# Patient Record
Sex: Female | Born: 1980 | Race: White | Hispanic: No | Marital: Married | Smoking: Former smoker
Health system: Southern US, Community
[De-identification: ages and names within clinical notes are randomized; demographics above are authoritative.]

## PROBLEM LIST (undated history)

## (undated) DIAGNOSIS — Z8659 Personal history of other mental and behavioral disorders: Secondary | ICD-10-CM

## (undated) DIAGNOSIS — R102 Pelvic and perineal pain unspecified side: Secondary | ICD-10-CM

## (undated) DIAGNOSIS — F952 Tourette's disorder: Secondary | ICD-10-CM

## (undated) DIAGNOSIS — F419 Anxiety disorder, unspecified: Secondary | ICD-10-CM

## (undated) DIAGNOSIS — E7212 Methylenetetrahydrofolate reductase deficiency: Secondary | ICD-10-CM

## (undated) DIAGNOSIS — N920 Excessive and frequent menstruation with regular cycle: Secondary | ICD-10-CM

## (undated) DIAGNOSIS — K5909 Other constipation: Secondary | ICD-10-CM

## (undated) DIAGNOSIS — D259 Leiomyoma of uterus, unspecified: Secondary | ICD-10-CM

## (undated) DIAGNOSIS — Z1589 Genetic susceptibility to other disease: Secondary | ICD-10-CM

## (undated) DIAGNOSIS — Z8742 Personal history of other diseases of the female genital tract: Secondary | ICD-10-CM

## (undated) DIAGNOSIS — R760 Raised antibody titer: Secondary | ICD-10-CM

## (undated) DIAGNOSIS — R87629 Unspecified abnormal cytological findings in specimens from vagina: Secondary | ICD-10-CM

## (undated) HISTORY — DX: Genetic susceptibility to other disease: Z15.89

## (undated) HISTORY — DX: Unspecified abnormal cytological findings in specimens from vagina: R87.629

## (undated) HISTORY — DX: Methylenetetrahydrofolate reductase deficiency: E72.12

## (undated) HISTORY — DX: Raised antibody titer: R76.0

## (undated) HISTORY — PX: NO PAST SURGERIES: SHX2092

---

## 2013-02-19 HISTORY — PX: COLONOSCOPY WITH PROPOFOL: SHX5780

## 2015-08-09 LAB — OB RESULTS CONSOLE ANTIBODY SCREEN: Antibody Screen: NEGATIVE

## 2015-08-09 LAB — OB RESULTS CONSOLE ABO/RH: RH Type: POSITIVE

## 2015-08-09 LAB — OB RESULTS CONSOLE RPR: RPR: NONREACTIVE

## 2015-08-09 LAB — OB RESULTS CONSOLE GC/CHLAMYDIA
CHLAMYDIA, DNA PROBE: NEGATIVE
GC PROBE AMP, GENITAL: NEGATIVE

## 2015-08-09 LAB — OB RESULTS CONSOLE RUBELLA ANTIBODY, IGM: Rubella: IMMUNE

## 2015-08-09 LAB — OB RESULTS CONSOLE HEPATITIS B SURFACE ANTIGEN: Hepatitis B Surface Ag: NEGATIVE

## 2015-08-09 LAB — OB RESULTS CONSOLE HIV ANTIBODY (ROUTINE TESTING): HIV: NONREACTIVE

## 2016-01-09 NOTE — L&D Delivery Note (Signed)
Delivery Note: requested for CNM birth, MD in OR  First Stage: Labor onset: 2/21 @2300  IOL with cytotec and pitocin  Analgesia /Anesthesia intrapartum: epidural AROM at 0226 Amnioinfusion for deep variable decelerations   Second Stage: Complete dilation at 1056 Onset of pushing at 1100 FHR second stage Cat 3- repositioned, oxygen given   Delivery of a viable female at 11 by CNM in ROA position No nuchal cord Cord double clamped after cessation of pulsation, cut by father of baby Cord blood sample collected   Arterial cord blood sample collected   Third Stage: Placenta delivered [facility] intact with 3 VC @ 1143 Placenta disposition: pathology Uterine tone firm / bleeding moderate   1st degree perineal and sulcus laceration identified  Anesthesia for repair: epidural Repair 3.0 vicryl, 4.0 vicryl  Est. Blood Loss (mL): [id number]  Complications: IUGR and oligo   Mom to postpartum.  Baby to Couplet care / Skin to Skin.  Newborn: Baby Name: Julia Santos  Birth Weight: [id number] (6lb 8.4oz) Apgar Scores: 9/9  Feeding planned: Breast  Darrol Poke BSN, SNM 03/01/2016, 12:05 PM

## 2016-02-22 LAB — OB RESULTS CONSOLE GBS: STREP GROUP B AG: POSITIVE

## 2016-02-28 ENCOUNTER — Telehealth (HOSPITAL_COMMUNITY): Payer: Self-pay | Admitting: *Deleted

## 2016-02-28 ENCOUNTER — Other Ambulatory Visit: Payer: Self-pay | Admitting: Obstetrics & Gynecology

## 2016-02-28 ENCOUNTER — Encounter (HOSPITAL_COMMUNITY): Payer: Self-pay | Admitting: *Deleted

## 2016-02-28 NOTE — Telephone Encounter (Signed)
Preadmission screen  

## 2016-02-29 ENCOUNTER — Encounter (HOSPITAL_COMMUNITY): Payer: Self-pay | Admitting: *Deleted

## 2016-02-29 ENCOUNTER — Inpatient Hospital Stay (HOSPITAL_COMMUNITY)
Admission: AD | Admit: 2016-02-29 | Discharge: 2016-03-03 | DRG: 775 | Disposition: A | Discharge status: Home / Self Care | Payer: Commercial Managed Care - PPO | Source: Ambulatory Visit | Attending: Obstetrics and Gynecology | Admitting: Obstetrics and Gynecology

## 2016-02-29 ENCOUNTER — Other Ambulatory Visit: Payer: Self-pay | Admitting: Obstetrics & Gynecology

## 2016-02-29 DIAGNOSIS — O9902 Anemia complicating childbirth: Secondary | ICD-10-CM | POA: Diagnosis not present

## 2016-02-29 DIAGNOSIS — O365930 Maternal care for other known or suspected poor fetal growth, third trimester, not applicable or unspecified: Secondary | ICD-10-CM | POA: Diagnosis present

## 2016-02-29 DIAGNOSIS — O480 Post-term pregnancy: Secondary | ICD-10-CM | POA: Diagnosis present

## 2016-02-29 DIAGNOSIS — Z833 Family history of diabetes mellitus: Secondary | ICD-10-CM | POA: Diagnosis not present

## 2016-02-29 DIAGNOSIS — D6861 Antiphospholipid syndrome: Secondary | ICD-10-CM | POA: Diagnosis present

## 2016-02-29 DIAGNOSIS — Z3490 Encounter for supervision of normal pregnancy, unspecified, unspecified trimester: Secondary | ICD-10-CM

## 2016-02-29 DIAGNOSIS — O99824 Streptococcus B carrier state complicating childbirth: Secondary | ICD-10-CM | POA: Diagnosis present

## 2016-02-29 DIAGNOSIS — Z3A40 40 weeks gestation of pregnancy: Secondary | ICD-10-CM | POA: Diagnosis not present

## 2016-02-29 DIAGNOSIS — O4103X0 Oligohydramnios, third trimester, not applicable or unspecified: Secondary | ICD-10-CM | POA: Diagnosis present

## 2016-02-29 DIAGNOSIS — Z8249 Family history of ischemic heart disease and other diseases of the circulatory system: Secondary | ICD-10-CM

## 2016-02-29 DIAGNOSIS — O9912 Other diseases of the blood and blood-forming organs and certain disorders involving the immune mechanism complicating childbirth: Secondary | ICD-10-CM | POA: Diagnosis present

## 2016-02-29 DIAGNOSIS — D649 Anemia, unspecified: Secondary | ICD-10-CM | POA: Diagnosis present

## 2016-02-29 LAB — TYPE AND SCREEN
ABO/RH(D): A POS
Antibody Screen: NEGATIVE

## 2016-02-29 LAB — CBC
HCT: 34.7 % — ABNORMAL LOW (ref 36.0–46.0)
Hemoglobin: 12.6 g/dL (ref 12.0–15.0)
MCH: 34.4 pg — AB (ref 26.0–34.0)
MCHC: 36.3 g/dL — AB (ref 30.0–36.0)
MCV: 94.8 fL (ref 78.0–100.0)
PLATELETS: 293 10*3/uL (ref 150–400)
RBC: 3.66 MIL/uL — AB (ref 3.87–5.11)
RDW: 12 % (ref 11.5–15.5)
WBC: 10.3 10*3/uL (ref 4.0–10.5)

## 2016-02-29 LAB — ABO/RH: ABO/RH(D): A POS

## 2016-02-29 MED ORDER — LACTATED RINGERS IV SOLN: 500.0000 mL | INTRAVENOUS | Status: DC | PRN

## 2016-02-29 MED ORDER — PENICILLIN G POTASSIUM 5000000 UNITS IJ SOLR: 2.5000 10*6.[IU] | INTRAVENOUS | Status: DC

## 2016-02-29 MED ORDER — PENICILLIN G POTASSIUM 5000000 UNITS IJ SOLR
5.0000 10*6.[IU] | Freq: Once | INTRAVENOUS | Status: AC
  Administered 2016-02-29: 5 10*6.[IU] via INTRAVENOUS
  Filled 2016-02-29: qty 5

## 2016-02-29 MED ORDER — PENICILLIN G POTASSIUM 5000000 UNITS IJ SOLR: 5.0000 10*6.[IU] | Freq: Once | INTRAVENOUS | Status: DC

## 2016-02-29 MED ORDER — SOD CITRATE-CITRIC ACID 500-334 MG/5ML PO SOLN
30.0000 mL | ORAL | Status: DC | PRN
  Administered 2016-03-01: 30 mL via ORAL
  Filled 2016-02-29: qty 15

## 2016-02-29 MED ORDER — LIDOCAINE HCL (PF) 1 % IJ SOLN
30.0000 mL | INTRAMUSCULAR | Status: DC | PRN
  Filled 2016-02-29: qty 30

## 2016-02-29 MED ORDER — OXYTOCIN BOLUS FROM INFUSION
500.0000 mL | Freq: Once | INTRAVENOUS | Status: AC
  Administered 2016-03-01: 500 mL via INTRAVENOUS

## 2016-02-29 MED ORDER — TERBUTALINE SULFATE 1 MG/ML IJ SOLN
0.2500 mg | Freq: Once | INTRAMUSCULAR | Status: DC | PRN
  Filled 2016-02-29: qty 1

## 2016-02-29 MED ORDER — OXYCODONE-ACETAMINOPHEN 5-325 MG PO TABS: 1.0000 | ORAL_TABLET | ORAL | Status: DC | PRN

## 2016-02-29 MED ORDER — OXYTOCIN 40 UNITS IN LACTATED RINGERS INFUSION - SIMPLE MED: 2.5000 [IU]/h | INTRAVENOUS | Status: DC

## 2016-02-29 MED ORDER — ACETAMINOPHEN 325 MG PO TABS: 650.0000 mg | ORAL_TABLET | ORAL | Status: DC | PRN

## 2016-02-29 MED ORDER — OXYCODONE-ACETAMINOPHEN 5-325 MG PO TABS: 2.0000 | ORAL_TABLET | ORAL | Status: DC | PRN

## 2016-02-29 MED ORDER — ONDANSETRON HCL 4 MG/2ML IJ SOLN
4.0000 mg | Freq: Four times a day (QID) | INTRAMUSCULAR | Status: DC | PRN
  Filled 2016-02-29 (×2): qty 2

## 2016-02-29 MED ORDER — LACTATED RINGERS IV SOLN
INTRAVENOUS | Status: DC
  Administered 2016-02-29 – 2016-03-01 (×5): via INTRAVENOUS

## 2016-02-29 MED ORDER — MISOPROSTOL 25 MCG QUARTER TABLET: 25.0000 ug | ORAL_TABLET | ORAL | Status: DC | PRN

## 2016-02-29 MED ORDER — MISOPROSTOL 25 MCG QUARTER TABLET
25.0000 ug | ORAL_TABLET | ORAL | Status: DC | PRN
  Administered 2016-02-29 (×2): 25 ug via VAGINAL
  Filled 2016-02-29 (×2): qty 0.25
  Filled 2016-02-29: qty 1
  Filled 2016-02-29: qty 0.25

## 2016-02-29 MED ORDER — PENICILLIN G POTASSIUM 5000000 UNITS IJ SOLR
2.5000 10*6.[IU] | INTRAVENOUS | Status: DC
  Administered 2016-02-29 (×2): 2.5 10*6.[IU] via INTRAVENOUS
  Filled 2016-02-29 (×6): qty 2.5

## 2016-02-29 NOTE — Anesthesia Pain Management Evaluation Note (Signed)
  CRNA Pain Management Visit Note  Patient: Julia Santos, 36 y.o., female  "Hello I am a member of the anesthesia team at [facility]. We have an anesthesia team available at all times to provide care throughout the hospital, including epidural management and anesthesia for C-section. I don't know your plan for the delivery whether it a natural birth, water birth, IV sedation, nitrous supplementation, doula or epidural, but we want to meet your pain goals."   1.Was your pain managed to your expectations on prior hospitalizations?   No prior hospitalizations  2.What is your expectation for pain management during this hospitalization?     Nitrous Oxide  3.How can we help you reach that goal? Be available,  Record the patient's initial score and the patient's pain goal.   Pain: 0  Pain Goal: 7 [facility] wants you to be able to say your pain was always managed very well.  Ninnie Fein 02/29/2016

## 2016-02-29 NOTE — H&P (Addendum)
Julia Santos is a 36 y.o. female G29P0A3 with Antiphospholipid Syndrome at [redacted]w[redacted]d presenting for Induction of Labor re BPP 6/10 with SGA and low AFI.  OB History    Gravida Para Term Preterm AB Living   1             SAB TAB Ectopic Multiple Live Births                 Past Medical History:  Diagnosis Date  . Anticardiolipin antibody positive   . Heterozygous MTHFR mutation C677T ([place])   . Vaginal Pap smear, abnormal    No past surgical history on file. Family History: family history includes COPD in her maternal grandfather and paternal aunt; Diabetes in her maternal grandfather; Esophageal cancer in her maternal uncle; Hypertension in her father. Social History:  has no tobacco, alcohol, and drug history on file.  Allergies not on file    Vital Signs    Report    02/20 0700 02/21 0659 02/21 0700 02/21 1537  Most Recent    Temp (F)   98.3  98.3 (36.8)  02/21 1423  Pulse Rate   105  105  02/21 1423  Resp   18  18  02/21 1423  BP   118/80  118/80  02/21 1423  Weight (lb)   191  191 lb (86.6 kg)  02/21 1407   Exam   Physical Exam   FHR 140's with good variability, no deceleration, accelerations present. No reg UC.  VE at [employer]:  Closed/long/Vtx/-3 Membranes intact.  HPP: There are no active problems to display for this patient.   Prenatal labs: ABO, Rh: A/Positive/-- (08/01 0000) Antibody: Negative (08/01 0000) Rubella: Immune RPR: Nonreactive (08/01 0000)  HBsAg: Negative (08/01 0000)  HIV: Non-reactive (08/01 0000)  Genetic testing: Panorama low risk female, AFP1 neg [place] anato: wnl female 1 hr GTT: 106 wnl GBS: Positive (02/14 0000)   Assessment/Plan: 36 yo G4P0A3 with Antiphospholipid Syndrome for Induction for BPP 6/10 with SGA and low AFI.  Unfavorable cervix, Cytotec first, then Pitocin.  Continuous monitoring.  Risks of Urgent C/S in labor discussed.  GBS pos, Pen G in labor.  Dr. Garwin Brothers on call informed of plan of care and will manage  patient.  Julia Santos 02/29/2016, 1:58 PM

## 2016-03-01 ENCOUNTER — Inpatient Hospital Stay (HOSPITAL_COMMUNITY): Payer: Commercial Managed Care - PPO | Admitting: Anesthesiology

## 2016-03-01 ENCOUNTER — Encounter (HOSPITAL_COMMUNITY): Payer: Self-pay | Admitting: *Deleted

## 2016-03-01 LAB — RPR: RPR Ser Ql: NONREACTIVE

## 2016-03-01 MED ORDER — SENNOSIDES-DOCUSATE SODIUM 8.6-50 MG PO TABS
2.0000 | ORAL_TABLET | ORAL | Status: DC
  Administered 2016-03-01 – 2016-03-03 (×2): 2 via ORAL
  Filled 2016-03-01 (×2): qty 2

## 2016-03-01 MED ORDER — SIMETHICONE 80 MG PO CHEW: 80.0000 mg | CHEWABLE_TABLET | ORAL | Status: DC | PRN

## 2016-03-01 MED ORDER — PRENATAL MULTIVITAMIN CH
1.0000 | ORAL_TABLET | Freq: Every day | ORAL | Status: DC
  Administered 2016-03-02 – 2016-03-03 (×2): 1 via ORAL
  Filled 2016-03-01 (×2): qty 1

## 2016-03-01 MED ORDER — PENICILLIN G POT IN DEXTROSE 60000 UNIT/ML IV SOLN
3.0000 10*6.[IU] | INTRAVENOUS | Status: DC
Start: 2016-03-01 — End: 2016-03-01
  Administered 2016-03-01 (×2): 3 10*6.[IU] via INTRAVENOUS
  Filled 2016-03-01 (×9): qty 50

## 2016-03-01 MED ORDER — DIPHENHYDRAMINE HCL 25 MG PO CAPS: 25.0000 mg | ORAL_CAPSULE | Freq: Four times a day (QID) | ORAL | Status: DC | PRN

## 2016-03-01 MED ORDER — LACTATED RINGERS IV SOLN
500.0000 mL | Freq: Once | INTRAVENOUS | Status: AC
  Administered 2016-03-01: 500 mL via INTRAVENOUS

## 2016-03-01 MED ORDER — WITCH HAZEL-GLYCERIN EX PADS: 1.0000 "application " | MEDICATED_PAD | CUTANEOUS | Status: DC | PRN

## 2016-03-01 MED ORDER — NALBUPHINE HCL 10 MG/ML IJ SOLN
10.0000 mg | Freq: Once | INTRAMUSCULAR | Status: AC
  Administered 2016-03-01: 10 mg via INTRAVENOUS
  Filled 2016-03-01: qty 1

## 2016-03-01 MED ORDER — PROMETHAZINE HCL 25 MG/ML IJ SOLN
12.5000 mg | Freq: Once | INTRAMUSCULAR | Status: AC
  Administered 2016-03-01: 12.5 mg via INTRAVENOUS
  Filled 2016-03-01: qty 1

## 2016-03-01 MED ORDER — PHENYLEPHRINE 40 MCG/ML (10ML) SYRINGE FOR IV PUSH (FOR BLOOD PRESSURE SUPPORT)
80.0000 ug | PREFILLED_SYRINGE | INTRAVENOUS | Status: DC | PRN
  Filled 2016-03-01: qty 5
  Filled 2016-03-01: qty 10

## 2016-03-01 MED ORDER — NALBUPHINE HCL 10 MG/ML IJ SOLN
10.0000 mg | Freq: Once | INTRAMUSCULAR | Status: AC
  Administered 2016-03-01: 10 mg via INTRAMUSCULAR
  Filled 2016-03-01: qty 1

## 2016-03-01 MED ORDER — EPHEDRINE 5 MG/ML INJ
10.0000 mg | INTRAVENOUS | Status: DC | PRN
  Filled 2016-03-01: qty 4

## 2016-03-01 MED ORDER — ONDANSETRON HCL 4 MG/2ML IJ SOLN: 4.0000 mg | INTRAMUSCULAR | Status: DC | PRN

## 2016-03-01 MED ORDER — PHENYLEPHRINE 40 MCG/ML (10ML) SYRINGE FOR IV PUSH (FOR BLOOD PRESSURE SUPPORT)
80.0000 ug | PREFILLED_SYRINGE | INTRAVENOUS | Status: DC | PRN
  Administered 2016-03-01 (×2): 80 ug via INTRAVENOUS
  Filled 2016-03-01: qty 5

## 2016-03-01 MED ORDER — ACETAMINOPHEN 325 MG PO TABS
650.0000 mg | ORAL_TABLET | ORAL | Status: DC | PRN
  Administered 2016-03-02 (×3): 650 mg via ORAL
  Filled 2016-03-01 (×3): qty 2

## 2016-03-01 MED ORDER — COCONUT OIL OIL: 1.0000 "application " | TOPICAL_OIL | Status: DC | PRN

## 2016-03-01 MED ORDER — LACTATED RINGERS IV SOLN
INTRAVENOUS | Status: DC
  Administered 2016-03-01: 11:00:00 via INTRAUTERINE

## 2016-03-01 MED ORDER — ONDANSETRON HCL 4 MG PO TABS: 4.0000 mg | ORAL_TABLET | ORAL | Status: DC | PRN

## 2016-03-01 MED ORDER — BENZOCAINE-MENTHOL 20-0.5 % EX AERO: 1.0000 "application " | INHALATION_SPRAY | CUTANEOUS | Status: DC | PRN

## 2016-03-01 MED ORDER — FENTANYL 2.5 MCG/ML BUPIVACAINE 1/10 % EPIDURAL INFUSION (WH - ANES)
14.0000 mL/h | INTRAMUSCULAR | Status: DC | PRN
  Administered 2016-03-01: 14 mL/h via EPIDURAL
  Filled 2016-03-01 (×2): qty 100

## 2016-03-01 MED ORDER — LIDOCAINE HCL (PF) 1 % IJ SOLN
INTRAMUSCULAR | Status: DC | PRN
  Administered 2016-03-01: 6 mL
  Administered 2016-03-01: 4 mL via EPIDURAL

## 2016-03-01 MED ORDER — BUTORPHANOL TARTRATE 1 MG/ML IJ SOLN: 2.0000 mg | Freq: Once | INTRAMUSCULAR | Status: DC

## 2016-03-01 MED ORDER — IBUPROFEN 600 MG PO TABS
600.0000 mg | ORAL_TABLET | Freq: Four times a day (QID) | ORAL | Status: DC
  Administered 2016-03-01 – 2016-03-03 (×8): 600 mg via ORAL
  Filled 2016-03-01 (×9): qty 1

## 2016-03-01 MED ORDER — DIPHENHYDRAMINE HCL 50 MG/ML IJ SOLN: 12.5000 mg | INTRAMUSCULAR | Status: DC | PRN

## 2016-03-01 MED ORDER — OXYTOCIN 40 UNITS IN LACTATED RINGERS INFUSION - SIMPLE MED
1.0000 m[IU]/min | INTRAVENOUS | Status: DC
  Administered 2016-03-01: 2 m[IU]/min via INTRAVENOUS
  Filled 2016-03-01: qty 1000

## 2016-03-01 MED ORDER — DIBUCAINE 1 % RE OINT: 1.0000 "application " | TOPICAL_OINTMENT | RECTAL | Status: DC | PRN

## 2016-03-01 NOTE — Progress Notes (Signed)
Update since AROM  Tracing reviewed. Baseline 140  Small variables occ  Subtle late ( non repetitive). Some early Small variability Ctx q 2-4mins  IMP; oligohydramnios Post term Tracing ok P) close fetal monitoring. Defer pitocin augmentation and ctx are becoming more regular. See if spont active labor going forward

## 2016-03-01 NOTE — Anesthesia Preprocedure Evaluation (Signed)

## 2016-03-01 NOTE — Progress Notes (Signed)
S/p epidural BP (!) 95/54   Pulse 82   Temp 98 F (36.7 C) (Oral)   Resp 18   Ht 5\' 7"  (1.702 m)   Wt 86.6 kg (191 lb)   SpO2 100%   BMI 29.91 kg/m   Currently with low BP post epidural  VE deferred  Tracing: baseline 140 (+) min variability with intermittent late and some early decel Ctx q 4-5 mins  IMP: oligohydramnios Term gestation Hypotension related tracing decel P)resolve BP issue and pitocin augmentation with  Reassuring tracing

## 2016-03-01 NOTE — Anesthesia Procedure Notes (Signed)
Epidural Patient location during procedure: OB  Staffing Anesthesiologist: Lyndle Herrlich Performed: anesthesiologist   Preanesthetic Checklist Completed: patient identified, site marked, surgical consent, pre-op evaluation, timeout performed, IV checked, risks and benefits discussed and monitors and equipment checked  Epidural Patient position: sitting Prep: Betadine Patient monitoring: heart rate, continuous pulse ox and blood pressure Location: L3-L4  Needle:  Needle type: Hustead  Needle gauge: 17 G Needle length: 9 cm and 9 Needle insertion depth: 5 cm Catheter type: closed end flexible Catheter size: 19 Gauge Catheter at skin depth: 11 cm Test dose: negative  Additional Notes Reason for block:procedure for pain

## 2016-03-01 NOTE — Lactation Note (Addendum)
This note was copied from a baby's chart. Lactation Consultation Note  Patient Name: Julia Santos [id number] Date: 03/01/2016 Reason for consult: Initial assessment   Initial assessment with first time mom at < 1 hour old in [place]. Mom reports she took BF class at [facility].  Mom had infant at breast and infant on and off the breast shallowly. Enc mom to pull infant in a little closer with latch. Mom did well with holding and latching infant. Reviewed BF basics, positioning, STS, pillow and head support, colostrum, milk coming to volume and hand expression. Enc mom to massage/compress breast with feeding. Infant on and off the breast with short suckling bursts while LC in room. Mom denied nipple pain/tenderness. Feeding log given with instructions for use.   Showed mom how to hand express, mom returned demo. No colostrum noted with hand expression to right breast at this time, infant latched to left breast. Mom reports breasts got a little bigger during pregnancy. Mom with compressible breasts and areola with some areolar puffiness and short shaft small nipples.   Enc mom to feed infant STS 8-12 x in 24 hours at first feeding cues. BF Resources Handout and [place] reviewed, mom informed of IP/OP Services, BF Support Groups and [place] phone #. Enc mom to call out to desk for feeding assistance as needed. Mom has a Spectra 2 pump for home use.    Maternal Data Formula Feeding for Exclusion: No Has patient been taught Hand Expression?: Yes Does the patient have breastfeeding experience prior to this delivery?: No  Feeding Feeding Type: Breast Fed Length of feed: 10 min  LATCH Score/Interventions Latch: Repeated attempts needed to sustain latch, nipple held in mouth throughout feeding, stimulation needed to elicit sucking reflex. Intervention(s): Adjust position;Assist with latch;Breast massage;Breast compression  Audible Swallowing: A few with stimulation Intervention(s): Skin to  skin;Hand expression;Alternate breast massage  Type of Nipple: Everted at rest and after stimulation  Comfort (Breast/Nipple): Soft / non-tender     Hold (Positioning): Assistance needed to correctly position infant at breast and maintain latch. Intervention(s): Breastfeeding basics reviewed;Support Pillows;Position options;Skin to skin  LATCH Score: 7  Lactation Tools Discussed/Used WIC Program: No   Consult Status Consult Status: Follow-up Date: 03/02/16 Follow-up type: In-patient    Debby Freiberg Kamren Heintzelman 03/01/2016, 12:26 PM

## 2016-03-01 NOTE — Progress Notes (Signed)
IOL by Dr Dellis Filbert S/p cytotec. Due for cytotec but was contracting too much Pt c/o pain. Tried nitrous oxide w/o relief S/P Nubain IV/IM  Tracing notable for 2 episodes of decelerations lasting 2 1/2- 35mins unclear if related To ctx. Baseline 135 decreased variability thought related to Nubain  VE loose 1 cm/80/-2 post AROM clear fluid ISE/IUPC placed  IMP: Fetal heart deceleration ? Variable vs spont P) assess fetal wellbeing with internal monitoring. If persistent decels, then will need C/S. Pt and family notified If need more analgesic for pain mgmt, will proceed with epidural given in the event of need for  Surgical intervention

## 2016-03-02 DIAGNOSIS — O9902 Anemia complicating childbirth: Secondary | ICD-10-CM | POA: Diagnosis not present

## 2016-03-02 LAB — CBC
HCT: 25.6 % — ABNORMAL LOW (ref 36.0–46.0)
Hemoglobin: 9.4 g/dL — ABNORMAL LOW (ref 12.0–15.0)
MCH: 35.1 pg — ABNORMAL HIGH (ref 26.0–34.0)
MCHC: 36.7 g/dL — ABNORMAL HIGH (ref 30.0–36.0)
MCV: 95.5 fL (ref 78.0–100.0)
PLATELETS: 207 10*3/uL (ref 150–400)
RBC: 2.68 MIL/uL — ABNORMAL LOW (ref 3.87–5.11)
RDW: 12.2 % (ref 11.5–15.5)
WBC: 10.6 10*3/uL — AB (ref 4.0–10.5)

## 2016-03-02 MED ORDER — POLYSACCHARIDE IRON COMPLEX 150 MG PO CAPS
150.0000 mg | ORAL_CAPSULE | Freq: Every day | ORAL | Status: DC
  Administered 2016-03-03: 150 mg via ORAL
  Filled 2016-03-02: qty 1

## 2016-03-02 MED ORDER — MAGNESIUM OXIDE 400 (241.3 MG) MG PO TABS
400.0000 mg | ORAL_TABLET | Freq: Every day | ORAL | Status: DC
  Administered 2016-03-03: 400 mg via ORAL
  Filled 2016-03-02 (×3): qty 1

## 2016-03-02 NOTE — Anesthesia Postprocedure Evaluation (Signed)
Anesthesia Post Note  Patient: Julia Santos  Procedure(s) Performed: * No procedures listed *  Patient location during evaluation: Mother Baby Anesthesia Type: Epidural Level of consciousness: awake and alert, oriented and patient cooperative Pain management: pain level controlled Vital Signs Assessment: post-procedure vital signs reviewed and stable Respiratory status: spontaneous breathing Cardiovascular status: stable Postop Assessment: no headache, epidural receding, patient able to bend at knees and no signs of nausea or vomiting Anesthetic complications: no Comments: Pain Score 2.        Last Vitals:  Vitals:   03/01/16 1847 03/02/16 0620  BP: (!) 124/58 (!) 95/45  Pulse: 76 66  Resp: 18 16  Temp: 36.8 C 36.7 C    Last Pain:  Vitals:   03/02/16 0620  TempSrc: Oral  PainSc:    Pain Goal:                 [facility]

## 2016-03-02 NOTE — Lactation Note (Signed)
This note was copied from a baby's chart. Lactation Consultation Note  Patient Name: Girl Yaritzy Eborn [id number] Date: 03/02/2016 Reason for consult: Follow-up assessment;Hyperbilirubinemia  Visited with Mom, baby 58 hrs old.  Mom resting in bed, and baby just started on single phototherapy with a bilirubin level of 10.5.  Offered assist with latching baby.  Mom very calm and handles the latch very well.  Understands importance of a deep areolar latch.  Baby latched well in football hold.  Baby sleepy and needing frequent stimulation.  Set up DEBP at bedside, and instructed Mom to call her RN to assist her with her first pumping.  Demonstrated alternate breast compression while baby feeding.   Encouraged waking baby at 3 hrs to place baby STS to encourage more feedings.   Mom aware of sleepiness at the breast associated with jaundice. Lactation to follow up prn and in morning.  Consult Status Consult Status: Follow-up Date: 03/03/16 Follow-up type: In-patient    Broadus John 03/02/2016, 5:21 PM

## 2016-03-02 NOTE — Lactation Note (Signed)
This note was copied from a baby's chart. Lactation Consultation Note  Patient Name: Julia Santos [id number] Date: 03/02/2016 Reason for consult: Follow-up assessment;Hyperbilirubinemia Mom called for assist with using DEBP. Reviewed pumping, how to clean parts/pieces. Offered to assist with latch but baby sleeping and parents report recently BF. Encouraged Mom to call with next feeding for assist since she reports having difficulty with baby latching to right breast. Encouraged Mom to pump every 3 hours for 15 minutes to encourage milk production and to have EBM to supplement. Mom to call for latch assist.   Maternal Data    Feeding Feeding Type: Breast Milk  LATCH Score/Interventions Latch: Repeated attempts needed to sustain latch, nipple held in mouth throughout feeding, stimulation needed to elicit sucking reflex. Intervention(s): Adjust position;Assist with latch;Breast massage;Breast compression  Audible Swallowing: A few with stimulation Intervention(s): Skin to skin;Hand expression;Alternate breast massage  Type of Nipple: Everted at rest and after stimulation  Comfort (Breast/Nipple): Soft / non-tender     Hold (Positioning): Assistance needed to correctly position infant at breast and maintain latch. Intervention(s): Breastfeeding basics reviewed;Support Pillows;Position options;Skin to skin  LATCH Score: 7  Lactation Tools Discussed/Used Tools: Pump Breast pump type: Double-Electric Breast Pump Pump Review: Setup, frequency, and cleaning;Milk Storage Initiated by:: Cipriano Mile RN IBCLC Date initiated:: 03/02/16   Consult Status Consult Status: Follow-up Date: 03/02/16 Follow-up type: In-patient    Katrine Coho 03/02/2016, 6:03 PM

## 2016-03-02 NOTE — Progress Notes (Signed)
PPD # 1 SVD Information for the patient's newborn:  Bernyce, Punches [id number]  female    Baby name: Ernestine  breastfeeding - going well, inf favors left breast over right.  Has been seen by lactation consultant - will continue to work with them to improve latch.  S:  Reports feeling sore from having a baby, but well.  Has cramping while breastfeeding.             Tolerating po/ No nausea or vomiting             Bleeding is light, no clots             Pain controlled with ibuprofen.  Breast symptoms: none  Denies dizziness/N/V/SOB/HA             Voiding without difficulties, passing gas   Wearing compression stockings on legs   Up ad lib / ambulatory without difficulty      O:  A & O x 3, in no apparent distress              VS:  Vitals:   03/01/16 1406 03/01/16 1510 03/01/16 1847 03/02/16 0620  BP: 109/68 116/68 (!) 124/58 (!) 95/45  Pulse: 75 78 76 66  Resp: 18 18 18 16   Temp: 98.2 F (36.8 C) 98.3 F (36.8 C) 98.2 F (36.8 C) 98.1 F (36.7 C)  TempSrc: Oral Oral Oral Oral  SpO2:      Weight:      Height:        LABS:  Recent Labs  02/29/16 1405 03/02/16 0520  WBC 10.3 10.6*  HGB 12.6 9.4*  HCT 34.7* 25.6*  PLT 293 207    Blood type: --/--/A POS (02/21 1422)  Rubella: Immune (08/01 0000)   I&O: I/O last 3 completed shifts: In: -  Out: [id number] [Urine:3800; Blood:250]          No intake/output data recorded.   General: alert, cooperative and no distress  Abdomen: soft, non-tender, non-distended             Fundus: firm, non-tender, U-1  Perineum: no edema  Lochia: minimal  Extremities: no edema, no calf pain or tenderness    A/P: PPD # 1 36 y.o., [id number]   Principal Problem:   Postpartum care following vaginal delivery (2/22) Active Problems:   Maternal anemia, with delivery - aysmptomatic  Oral Fe and Mag supplements  Routine post partum orders   Continue working with Science writer to improve latch.   Doing well - stable  status  Anticipate discharge tomorrow    Crista Luria, BSN, SNM 03/02/2016, 9:06 AM

## 2016-03-03 MED ORDER — IBUPROFEN 600 MG PO TABS: 600.0000 mg | ORAL_TABLET | Freq: Four times a day (QID) | ORAL | 0 refills | Status: DC

## 2016-03-03 MED ORDER — POLYSACCHARIDE IRON COMPLEX 150 MG PO CAPS: 150.0000 mg | ORAL_CAPSULE | Freq: Every day | ORAL | 3 refills | Status: DC

## 2016-03-03 NOTE — Discharge Instructions (Signed)
Postpartum Depression and Baby Blues The postpartum period begins right after the birth of a baby. During this time, there is often a great amount of joy and excitement. It is also a time of many changes in the life of the parents. Regardless of how many times a mother gives birth, each child brings new challenges and dynamics to the family. It is not unusual to have feelings of excitement along with confusing shifts in moods, emotions, and thoughts. All mothers are at risk of developing postpartum depression or the "baby blues." These mood changes can occur right after giving birth, or they may occur many months after giving birth. The baby blues or postpartum depression can be mild or severe. Additionally, postpartum depression can go away rather quickly, or it can be a long-term condition. What are the causes? Raised hormone levels and the rapid drop in those levels are thought to be a main cause of postpartum depression and the baby blues. A number of hormones change during and after pregnancy. Estrogen and progesterone usually decrease right after the delivery of your baby. The levels of thyroid hormone and various cortisol steroids also rapidly drop. Other factors that play a role in these mood changes include major life events and genetics. What increases the risk? If you have any of the following risks for the baby blues or postpartum depression, know what symptoms to watch out for during the postpartum period. Risk factors that may increase the likelihood of getting the baby blues or postpartum depression include:  Having a personal or family history of depression.  Having depression while being pregnant.  Having premenstrual mood issues or mood issues related to oral contraceptives.  Having a lot of life stress.  Having marital conflict.  Lacking a social support network.  Having a baby with special needs.  Having health problems, such as diabetes. What are the signs or  symptoms? Symptoms of baby blues include:  Brief changes in mood, such as going from extreme happiness to sadness.  Decreased concentration.  Difficulty sleeping.  Crying spells, tearfulness.  Irritability.  Anxiety. Symptoms of postpartum depression typically begin within the first month after giving birth. These symptoms include:  Difficulty sleeping or excessive sleepiness.  Marked weight loss.  Agitation.  Feelings of worthlessness.  Lack of interest in activity or food. Postpartum psychosis is a very serious condition and can be dangerous. Fortunately, it is rare. Displaying any of the following symptoms is cause for immediate medical attention. Symptoms of postpartum psychosis include:  Hallucinations and delusions.  Bizarre or disorganized behavior.  Confusion or disorientation. How is this diagnosed? A diagnosis is made by an evaluation of your symptoms. There are no medical or lab tests that lead to a diagnosis, but there are various questionnaires that a health care provider may use to identify those with the baby blues, postpartum depression, or psychosis. Often, a screening tool called the [place] Postnatal Depression Scale is used to diagnose depression in the postpartum period. How is this treated? The baby blues usually goes away on its own in 1-2 weeks. Social support is often all that is needed. You will be encouraged to get adequate sleep and rest. Occasionally, you may be given medicines to help you sleep. Postpartum depression requires treatment because it can last several months or longer if it is not treated. Treatment may include individual or group therapy, medicine, or both to address any social, physiological, and psychological factors that may play a role in the depression. Regular exercise, a  healthy diet, rest, and social support may also be strongly recommended. Postpartum psychosis is more serious and needs treatment right away. Hospitalization is  often needed. Follow these instructions at home:  Get as much rest as you can. Nap when the baby sleeps.  Exercise regularly. Some women find yoga and walking to be beneficial.  Eat a balanced and nourishing diet.  Do little things that you enjoy. Have a cup of tea, take a bubble bath, read your favorite magazine, or listen to your favorite music.  Avoid alcohol.  Ask for help with household chores, cooking, grocery shopping, or running errands as needed. Do not try to do everything.  Talk to people close to you about how you are feeling. Get support from your partner, family members, friends, or other new moms.  Try to stay positive in how you think. Think about the things you are grateful for.  Do not spend a lot of time alone.  Only take over-the-counter or prescription medicine as directed by your health care provider.  Keep all your postpartum appointments.  Let your health care provider know if you have any concerns. Contact a health care provider if: You are having a reaction to or problems with your medicine. Get help right away if:  You have suicidal feelings.  You think you may harm the baby or someone else. This information is not intended to replace advice given to you by your health care provider. Make sure you discuss any questions you have with your health care provider. Document Released: 09/29/2003 Document Revised: 06/02/2015 Document Reviewed: 10/06/2012 Elsevier Interactive Patient Education  2017 [employer] Instructions for Mom Introduction  ACTIVITY  Gradually return to your regular activities.  Let yourself rest. Nap while your baby sleeps.  Avoid lifting anything that is heavier than 10 lb (4.5 kg) until your health care provider says it is okay.  Avoid activities that take a lot of effort and energy (are strenuous) until approved by your health care provider. Walking at a slow-to-moderate pace is usually safe.  If you had a  cesarean delivery:  Do not vacuum, climb stairs, or drive a car for 4-6 weeks.  Have someone help you at home until you feel like you can do your usual activities yourself.  Do exercises as told by your health care provider, if this applies. VAGINAL BLEEDING You may continue to bleed for 4-6 weeks after delivery. Over time, the amount of blood usually decreases and the color of the blood usually gets lighter. However, the flow of bright red blood may increase if you have been too active. If you need to use more than one pad in an hour because your pad gets soaked, or if you pass a large clot:  Lie down.  Raise your feet.  Place a cold compress on your lower abdomen.  Rest.  Call your health care provider. If you are breastfeeding, your period should return anytime between 8 weeks after delivery and the time that you stop breastfeeding. If you are not breastfeeding, your period should return 6-8 weeks after delivery. PERINEAL CARE The perineal area, or perineum, is the part of your body between your thighs. After delivery, this area needs special care. Follow these instructions as told by your health care provider.  Take warm tub baths for 15-20 minutes.  Use medicated pads and pain-relieving sprays and creams as told.  Do not use tampons or douches until vaginal bleeding has stopped.  Each time you go to  the bathroom:  Use a peri bottle.  Change your pad.  Use towelettes in place of toilet paper until your stitches have healed.  Do Kegel exercises every day. Kegel exercises help to maintain the muscles that support the vagina, bladder, and bowels. You can do these exercises while you are standing, sitting, or lying down. To do Kegel exercises:  Tighten the muscles of your abdomen and the muscles that surround your birth canal.  Hold for a few seconds.  Relax.  Repeat until you have done this 5 times in a row.  To prevent hemorrhoids from developing or getting  worse:  Drink enough fluid to keep your urine clear or pale yellow.  Avoid straining when having a bowel movement.  Take over-the-counter medicines and stool softeners as told by your health care provider. BREAST CARE  Wear a tight-fitting bra.  Avoid taking over-the-counter pain medicine for breast discomfort.  Apply ice to the breasts to help with discomfort as needed:  Put ice in a plastic bag.  Place a towel between your skin and the bag.  Leave the ice on for 20 minutes or as told by your health care provider. NUTRITION  Eat a well-balanced diet.  Do not try to lose weight quickly by cutting back on calories.  Take your prenatal vitamins until your postpartum checkup or until your health care provider tells you to stop. POSTPARTUM DEPRESSION You may find yourself crying for no apparent reason and unable to cope with all of the changes that come with having a newborn. This mood is called postpartum depression. Postpartum depression happens because your hormone levels change after delivery. If you have postpartum depression, get support from your partner, friends, and family. If the depression does not go away on its own after several weeks, contact your health care provider. BREAST SELF-EXAM Do a breast self-exam each month, at the same time of the month. If you are breastfeeding, check your breasts just after a feeding, when your breasts are less full. If you are breastfeeding and your period has started, check your breasts on day 5, 6, or 7 of your period. Report any lumps, bumps, or discharge to your health care provider. Know that breasts are normally lumpy if you are breastfeeding. This is temporary, and it is not a health risk. INTIMACY AND SEXUALITY Avoid sexual activity for at least 3-4 weeks after delivery or until the brownish-red vaginal flow is completely gone. If you want to avoid pregnancy, use some form of birth control. You can get pregnant after delivery, even if  you have not had your period. SEEK MEDICAL CARE IF:  You feel unable to cope with the changes that a child brings to your life, and these feelings do not go away after several weeks.  You notice a lump, a bump, or discharge on your breast. SEEK IMMEDIATE MEDICAL CARE IF:  Blood soaks your pad in 1 hour or less.  You have:  Severe pain or cramping in your lower abdomen.  A bad-smelling vaginal discharge.  A fever that is not controlled by medicine.  A fever, and an area of your breast is red and sore.  Pain or redness in your calf.  Sudden, severe chest pain.  Shortness of breath.  Painful or bloody urination.  Problems with your vision.  You vomit for 12 hours or longer.  You develop a severe headache.  You have serious thoughts about hurting yourself, your child, or anyone else. This information is not intended to  replace advice given to you by your health care provider. Make sure you discuss any questions you have with your health care provider. Document Released: 12/23/1999 Document Revised: 06/02/2015 Document Reviewed: 06/28/2014  2017 Elsevier Breastfeeding and Mastitis Mastitis is inflammation of the breast tissue. It can occur in women who are breastfeeding. This can make breastfeeding painful. Mastitis will sometimes go away on its own. Your health care provider will help determine if treatment is needed. CAUSES Mastitis is often associated with a blocked milk (lactiferous) duct. This can happen when too much milk builds up in the breast. Causes of excess milk in the breast can include:  Poor latch-on. If your baby is not latched onto the breast properly, she or he may not empty your breast completely while breastfeeding.  Allowing too much time to pass between feedings.  Wearing a bra or other clothing that is too tight. This puts extra pressure on the lactiferous ducts so milk does not flow through them as it should. Mastitis can also be caused by a  bacterial infection. Bacteria may enter the breast tissue through cuts or openings in the skin. In women who are breastfeeding, this may occur because of cracked or irritated skin. Cracks in the skin are often caused when your baby does not latch on properly to the breast. SIGNS AND SYMPTOMS  Swelling, redness, tenderness, and pain in an area of the breast.  Swelling of the glands under the arm on the same side.  Fever may or may not accompany mastitis. If an infection is allowed to progress, a collection of pus (abscess) may develop. DIAGNOSIS  Your health care provider can usually diagnose mastitis based on your symptoms and a physical exam. Tests may be done to help confirm the diagnosis. These may include:  Removal of pus from the breast by applying pressure to the area. This pus can be examined in the lab to determine which bacteria are present. If an abscess has developed, the fluid in the abscess can be removed with a needle. This can also be used to confirm the diagnosis and determine the bacteria present. In most cases, pus will not be present.  Blood tests to determine if your body is fighting a bacterial infection.  Mammogram or ultrasound tests to rule out other problems or diseases. TREATMENT  Mastitis that occurs with breastfeeding will sometimes go away on its own. Your health care provider may choose to wait 24 hours after first seeing you to decide whether a prescription medicine is needed. If your symptoms are worse after 24 hours, your health care provider will likely prescribe an antibiotic medicine to treat the mastitis. He or she will determine which bacteria are most likely causing the infection and will then select an appropriate antibiotic medicine. This is sometimes changed based on the results of tests performed to identify the bacteria, or if there is no response to the antibiotic medicine selected. Antibiotic medicines are usually given by mouth. You may also be given  medicine for pain. HOME CARE INSTRUCTIONS  Only take over-the-counter or prescription medicines for pain, fever, or discomfort as directed by your health care provider.  If your health care provider prescribed an antibiotic medicine, take the medicine as directed. Make sure you finish it even if you start to feel better.  Do not wear a tight or underwire bra. Wear a soft, supportive bra.  Increase your fluid intake, especially if you have a fever.  Continue to empty the breast. Your health care provider  can tell you whether this milk is safe for your infant or needs to be thrown out. You may be told to stop nursing until your health care provider thinks it is safe for your baby. Use a breast pump if you are advised to stop nursing.  Keep your nipples clean and dry.  Empty the first breast completely before going to the other breast. If your baby is not emptying your breasts completely for some reason, use a breast pump to empty your breasts.  If you go back to work, pump your breasts while at work to stay in time with your nursing schedule.  Avoid allowing your breasts to become overly filled with milk (engorged). SEEK MEDICAL CARE IF:  You have pus-like discharge from the breast.  Your symptoms do not improve with the treatment prescribed by your health care provider within 2 days. SEEK IMMEDIATE MEDICAL CARE IF:  Your pain and swelling are getting worse.  You have pain that is not controlled with medicine.  You have a red line extending from the breast toward your armpit.  You have a fever or persistent symptoms for more than 2-3 days.  You have a fever and your symptoms suddenly get worse. MAKE SURE YOU:   Understand these instructions.  Will watch your condition.  Will get help right away if you are not doing well or get worse. This information is not intended to replace advice given to you by your health care provider. Make sure you discuss any questions you have with  your health care provider. Document Released: 04/21/2004 Document Revised: 12/30/2012 Document Reviewed: 07/31/2012 Elsevier Interactive Patient Education  2017 [employer]. Breastfeeding Deciding to breastfeed is one of the best choices you can make for you and your baby. A change in hormones during pregnancy causes your breast tissue to grow and increases the number and size of your milk ducts. These hormones also allow proteins, sugars, and fats from your blood supply to make breast milk in your milk-producing glands. Hormones prevent breast milk from being released before your baby is born as well as prompt milk flow after birth. Once breastfeeding has begun, thoughts of your baby, as well as his or her sucking or crying, can stimulate the release of milk from your milk-producing glands. Benefits of breastfeeding For Your Baby  Your first milk (colostrum) helps your baby's digestive system function better.  There are antibodies in your milk that help your baby fight off infections.  Your baby has a lower incidence of asthma, allergies, and sudden infant death syndrome.  The nutrients in breast milk are better for your baby than infant formulas and are designed uniquely for your babys needs.  Breast milk improves your baby's brain development.  Your baby is less likely to develop other conditions, such as childhood obesity, asthma, or type 2 diabetes mellitus. For You  Breastfeeding helps to create a very special bond between you and your baby.  Breastfeeding is convenient. Breast milk is always available at the correct temperature and costs nothing.  Breastfeeding helps to burn calories and helps you lose the weight gained during pregnancy.  Breastfeeding makes your uterus contract to its prepregnancy size faster and slows bleeding (lochia) after you give birth.  Breastfeeding helps to lower your risk of developing type 2 diabetes mellitus, osteoporosis, and breast or ovarian  cancer later in life. Signs that your baby is hungry Early Signs of Hunger  Increased alertness or activity.  Stretching.  Movement of the head from  side to side.  Movement of the head and opening of the mouth when the corner of the mouth or cheek is stroked (rooting).  Increased sucking sounds, smacking lips, cooing, sighing, or squeaking.  Hand-to-mouth movements.  Increased sucking of fingers or hands. Late Signs of Hunger  Fussing.  Intermittent crying. Extreme Signs of Hunger  Signs of extreme hunger will require calming and consoling before your baby will be able to breastfeed successfully. Do not wait for the following signs of extreme hunger to occur before you initiate breastfeeding:  Restlessness.  A loud, strong cry.  Screaming. Breastfeeding basics  Breastfeeding Initiation  Find a comfortable place to sit or lie down, with your neck and back well supported.  Place a pillow or rolled up blanket under your baby to bring him or her to the level of your breast (if you are seated). Nursing pillows are specially designed to help support your arms and your baby while you breastfeed.  Make sure that your baby's abdomen is facing your abdomen.  Gently massage your breast. With your fingertips, massage from your chest wall toward your nipple in a circular motion. This encourages milk flow. You may need to continue this action during the feeding if your milk flows slowly.  Support your breast with 4 fingers underneath and your thumb above your nipple. Make sure your fingers are well away from your nipple and your babys mouth.  Stroke your baby's lips gently with your finger or nipple.  When your baby's mouth is open wide enough, quickly bring your baby to your breast, placing your entire nipple and as much of the colored area around your nipple (areola) as possible into your baby's mouth.  More areola should be visible above your baby's upper lip than below the lower  lip.  Your baby's tongue should be between his or her lower gum and your breast.  Ensure that your baby's mouth is correctly positioned around your nipple (latched). Your baby's lips should create a seal on your breast and be turned out (everted).  It is common for your baby to suck about 2-3 minutes in order to start the flow of breast milk. Latching  Teaching your baby how to latch on to your breast properly is very important. An improper latch can cause nipple pain and decreased milk supply for you and poor weight gain in your baby. Also, if your baby is not latched onto your nipple properly, he or she may swallow some air during feeding. This can make your baby fussy. Burping your baby when you switch breasts during the feeding can help to get rid of the air. However, teaching your baby to latch on properly is still the best way to prevent fussiness from swallowing air while breastfeeding. Signs that your baby has successfully latched on to your nipple:  Silent tugging or silent sucking, without causing you pain.  Swallowing heard between every 3-4 sucks.  Muscle movement above and in front of his or her ears while sucking. Signs that your baby has not successfully latched on to nipple:  Sucking sounds or smacking sounds from your baby while breastfeeding.  Nipple pain. If you think your baby has not latched on correctly, slip your finger into the corner of your babys mouth to break the suction and place it between your baby's gums. Attempt breastfeeding initiation again. Signs of Successful Breastfeeding  Signs from your baby:  A gradual decrease in the number of sucks or complete cessation of sucking.  Falling  asleep.  Relaxation of his or her body.  Retention of a small amount of milk in his or her mouth.  Letting go of your breast by himself or herself. Signs from you:  Breasts that have increased in firmness, weight, and size 1-3 hours after feeding.  Breasts that are  softer immediately after breastfeeding.  Increased milk volume, as well as a change in milk consistency and color by the fifth day of breastfeeding.  Nipples that are not sore, cracked, or bleeding. Signs That Your [employer] is Getting Enough Milk  Wetting at least 1-2 diapers during the first 24 hours after birth.  Wetting at least 5-6 diapers every 24 hours for the first week after birth. The urine should be clear or pale yellow by 5 days after birth.  Wetting 6-8 diapers every 24 hours as your baby continues to grow and develop.  At least 3 stools in a 24-hour period by age 35 days. The stool should be soft and yellow.  At least 3 stools in a 24-hour period by age 82 days. The stool should be seedy and yellow.  No loss of weight greater than 10% of birth weight during the first 60 days of age.  Average weight gain of 4-7 ounces (113-198 g) per week after age 2 days.  Consistent daily weight gain by age 28 days, without weight loss after the age of 2 weeks. After a feeding, your baby may spit up a small amount. This is common. Breastfeeding frequency and duration Frequent feeding will help you make more milk and can prevent sore nipples and breast engorgement. Breastfeed when you feel the need to reduce the fullness of your breasts or when your baby shows signs of hunger. This is called "breastfeeding on demand." Avoid introducing a pacifier to your baby while you are working to establish breastfeeding (the first 4-6 weeks after your baby is born). After this time you may choose to use a pacifier. Research has shown that pacifier use during the first year of a baby's life decreases the risk of sudden infant death syndrome (SIDS). Allow your baby to feed on each breast as long as he or she wants. Breastfeed until your baby is finished feeding. When your baby unlatches or falls asleep while feeding from the first breast, offer the second breast. Because newborns are often sleepy in the first few  weeks of life, you may need to awaken your baby to get him or her to feed. Breastfeeding times will vary from baby to baby. However, the following rules can serve as a guide to help you ensure that your baby is properly fed:  Newborns (babies 20 weeks of age or younger) may breastfeed every 1-3 hours.  Newborns should not go longer than 3 hours during the day or 5 hours during the night without breastfeeding.  You should breastfeed your baby a minimum of 8 times in a 24-hour period until you begin to introduce solid foods to your baby at around 58 months of age. Breast milk pumping Pumping and storing breast milk allows you to ensure that your baby is exclusively fed your breast milk, even at times when you are unable to breastfeed. This is especially important if you are going back to work while you are still breastfeeding or when you are not able to be present during feedings. Your lactation consultant can give you guidelines on how long it is safe to store breast milk. A breast pump is a machine that allows you to  pump milk from your breast into a sterile bottle. The pumped breast milk can then be stored in a refrigerator or freezer. Some breast pumps are operated by hand, while others use electricity. Ask your lactation consultant which type will work best for you. Breast pumps can be purchased, but some hospitals and breastfeeding support groups lease breast pumps on a monthly basis. A lactation consultant can teach you how to hand express breast milk, if you prefer not to use a pump. Caring for your breasts while you breastfeed Nipples can become dry, cracked, and sore while breastfeeding. The following recommendations can help keep your breasts moisturized and healthy:  Avoid using soap on your nipples.  Wear a supportive bra. Although not required, special nursing bras and tank tops are designed to allow access to your breasts for breastfeeding without taking off your entire bra or top. Avoid  wearing underwire-style bras or extremely tight bras.  Air dry your nipples for 3-82minutes after each feeding.  Use only cotton bra pads to absorb leaked breast milk. Leaking of breast milk between feedings is normal.  Use lanolin on your nipples after breastfeeding. Lanolin helps to maintain your skin's normal moisture barrier. If you use pure lanolin, you do not need to wash it off before feeding your baby again. Pure lanolin is not toxic to your baby. You may also hand express a few drops of breast milk and gently massage that milk into your nipples and allow the milk to air dry. In the first few weeks after giving birth, some women experience extremely full breasts (engorgement). Engorgement can make your breasts feel heavy, warm, and tender to the touch. Engorgement peaks within 3-5 days after you give birth. The following recommendations can help ease engorgement:  Completely empty your breasts while breastfeeding or pumping. You may want to start by applying warm, moist heat (in the shower or with warm water-soaked hand towels) just before feeding or pumping. This increases circulation and helps the milk flow. If your baby does not completely empty your breasts while breastfeeding, pump any extra milk after he or she is finished.  Wear a snug bra (nursing or regular) or tank top for 1-2 days to signal your body to slightly decrease milk production.  Apply ice packs to your breasts, unless this is too uncomfortable for you.  Make sure that your baby is latched on and positioned properly while breastfeeding. If engorgement persists after 48 hours of following these recommendations, contact your health care provider or a Science writer. Overall health care recommendations while breastfeeding  Eat healthy foods. Alternate between meals and snacks, eating 3 of each per day. Because what you eat affects your breast milk, some of the foods may make your baby more irritable than usual. Avoid  eating these foods if you are sure that they are negatively affecting your baby.  Drink milk, fruit juice, and water to satisfy your thirst (about 10 glasses a day).  Rest often, relax, and continue to take your prenatal vitamins to prevent fatigue, stress, and anemia.  Continue breast self-awareness checks.  Avoid chewing and smoking tobacco. Chemicals from cigarettes that pass into breast milk and exposure to secondhand smoke may harm your baby.  Avoid alcohol and drug use, including marijuana. Some medicines that may be harmful to your baby can pass through breast milk. It is important to ask your health care provider before taking any medicine, including all over-the-counter and prescription medicine as well as vitamin and herbal supplements. It is  possible to become pregnant while breastfeeding. If birth control is desired, ask your health care provider about options that will be safe for your baby. Contact a health care provider if:  You feel like you want to stop breastfeeding or have become frustrated with breastfeeding.  You have painful breasts or nipples.  Your nipples are cracked or bleeding.  Your breasts are red, tender, or warm.  You have a swollen area on either breast.  You have a fever or chills.  You have nausea or vomiting.  You have drainage other than breast milk from your nipples.  Your breasts do not become full before feedings by the fifth day after you give birth.  You feel sad and depressed.  Your baby is too sleepy to eat well.  Your baby is having trouble sleeping.  Your baby is wetting less than 3 diapers in a 24-hour period.  Your baby has less than 3 stools in a 24-hour period.  Your baby's skin or the white part of his or her eyes becomes yellow.  Your baby is not gaining weight by 76 days of age. Get help right away if:  Your baby is overly tired (lethargic) and does not want to wake up and feed.  Your baby develops an unexplained  fever. This information is not intended to replace advice given to you by your health care provider. Make sure you discuss any questions you have with your health care provider. Document Released: 12/25/2004 Document Revised: 06/08/2015 Document Reviewed: 06/18/2012 Elsevier Interactive Patient Education  2017 [employer] Tips If you are breastfeeding, there may be times when you cannot feed your baby directly. Returning to work or going on a trip are common examples. Pumping allows you to store breast milk and feed it to your baby later. You may not get much milk when you first start to pump. Your breasts should start to make more after a few days. If you pump at the times you usually feed your baby, you may be able to keep making enough milk to feed your baby without also using formula. The more often you pump, the more milk you will produce. When should I pump?  You can begin to pump soon after delivery. However, some experts recommend waiting about 4 weeks before giving your infant a bottle to make sure breastfeeding is going well.  If you plan to return to work, begin pumping a few weeks before. This will help you develop techniques that work best for you. It also lets you build up a supply of breast milk.  When you are with your infant, feed on demand and pump after each feeding.  When you are away from your infant for several hours, pump for about 15 minutes every 2-3 hours. Pump both breasts at the same time if you can.  If your infant has a formula feeding, make sure to pump around the same time.  If you drink any alcohol, wait 2 hours before pumping. How do I prepare to pump? Your let-down reflexis the natural reaction to stimulation that makes your breast milk flow. It is easier to stimulate this reflex when you are relaxed. Find relaxation techniques that work for you. If you have difficulty with your let-down reflex, try these methods:  Smell one of your  infant's blankets or an item of clothing.  Look at a picture or video of your infant.  Sit in a quiet, private space.  Massage the breast you plan to pump.  Place soothing warmth on the breast.  Play relaxing music. What are some general breast pumping tips?  Wash your hands before you pump. You do not need to wash your nipples or breasts.  There are three ways to pump.  You can use your hand to massage and compress your breast.  You can use a handheld manual pump.  You can use an electric pump.  Make sure the suction cup (flange) on the breast pump is the right size. Place the flange directly over the nipple. If it is the wrong size or placed the wrong way, it may be painful and cause nipple damage.  If pumping is uncomfortable, apply a small amount of purified or modified lanolin to your nipple and areola.  If you are using an electric pump, adjust the speed and suction power to be more comfortable.  If pumping is painful or if you are not getting very much milk, you may need a different type of pump. A lactation consultant can help you determine what type of pump to use.  Keep a full water bottle near you at all times. Drinking lots of fluid helps you make more milk.  You can store your milk to use later. Pumped breast milk can be stored in a sealable, sterile container or plastic bag. Label all stored breast milk with the date you pumped it.  Milk can stay out at room temperature for up to 8 hours.  You can store your milk in the refrigerator for up to 8 days.  You can store your milk in the freezer for 3 months. Thaw frozen milk using warm water. Do not put it in the microwave.  Do not smoke. Smoking can lower your milk supply and harm your infant. If you need help quitting, ask your health care provider to recommend a program. When should I call my health care provider or a lactation consultant?  You are having trouble pumping.  You are concerned that you are not  making enough milk.  You have nipple pain, soreness, or redness.  You want to use birth control. Birth control pills may lower your milk supply. Talk to your health care provider about your options. This information is not intended to replace advice given to you by your health care provider. Make sure you discuss any questions you have with your health care provider. Document Released: 06/14/2009 Document Revised: 06/08/2015 Document Reviewed: 10/17/2012 Elsevier Interactive Patient Education  2017 [employer].

## 2016-03-03 NOTE — Discharge Summary (Signed)
OB Discharge Summary     Patient Name: Julia Santos DOB: [birth year] MRN: [id number]  Date of admission: 02/29/2016 Delivering MD: Lajean Manes, BSN, SNM  / Artelia Laroche, MSN, CNM, [facility]  Date of discharge: 03/03/2016  Admitting diagnosis: INDUCTION Intrauterine pregnancy: [redacted]w[redacted]d     Secondary diagnosis:  Principal Problem:   Postpartum care following vaginal delivery (2/22) Active Problems:   SVD (spontaneous vaginal delivery)   Maternal anemia, with delivery  Additional problems: none     Discharge diagnosis: Term Pregnancy Delivered                                                                                                Post partum procedures:none  Augmentation: AROM and Pitocin  Complications: None  Hospital course:  Induction of Labor With Vaginal Delivery   36 y.o. yo [id number] at [redacted]w[redacted]d was admitted to the hospital 02/29/2016 for induction of labor.  Indication for induction: Antiphospholipid Syndrome, SGA and low AFI.Marland Kitchen  Patient had an uncomplicated labor course as follows: Membrane Rupture Time/Date: 2:26 AM ,03/01/2016   Intrapartum Procedures: Episiotomy: None [1]                                         Lacerations:  1st degree [2];Perineal [11];Sulcus [9]  Patient had delivery of a Viable infant.  Information for the patient's newborn:  Julia Santos, [name] Girl Julia Santos [id number]  Delivery Method: Vag-Spont   03/01/2016  Details of delivery can be found in separate delivery note.  Patient had a routine postpartum course. Patient is discharged home 03/03/16.  Physical exam  Vitals:   03/01/16 1847 03/02/16 0620 03/02/16 1848 03/03/16 0627  BP: (!) 124/58 (!) 95/45 118/65 100/74  Pulse: 76 66 80 66  Resp: 18 16 18 18   Temp: 98.2 F (36.8 C) 98.1 F (36.7 C) 98 F (36.7 C) 97.5 F (36.4 C)  TempSrc: Oral Oral Oral Oral  SpO2:      Weight:      Height:       General: alert, cooperative and no distress Lochia: appropriate Uterine Fundus: firm, midline, U-1 DVT  Evaluation: No evidence of DVT seen on physical exam. Negative Homan's sign. No cords or calf tenderness. No significant calf/ankle edema. Labs: Lab Results  Component Value Date   WBC 10.6 (H) 03/02/2016   HGB 9.4 (L) 03/02/2016   HCT 25.6 (L) 03/02/2016   MCV 95.5 03/02/2016   PLT 207 03/02/2016   No flowsheet data found.  Discharge instruction: per After Visit Summary and "Baby and Me Booklet".  After visit meds:  Allergies as of 03/03/2016   No Known Allergies     Medication List    TAKE these medications   DHA COMPLETE PO Take 1 tablet by mouth daily.   folic acid 1 MG tablet Commonly known as:  FOLVITE Take 1 mg by mouth daily.   ibuprofen 600 MG tablet Commonly known as:  ADVIL,MOTRIN Take 1 tablet (600 mg total) by mouth every 6 (six) hours.  iron polysaccharides 150 MG capsule Commonly known as:  NIFEREX Take 1 capsule (150 mg total) by mouth daily.   magnesium oxide 400 MG tablet Commonly known as:  MAG-OX Take 400 mg by mouth daily.   prenatal multivitamin Tabs tablet Take 1 tablet by mouth daily at 12 noon.       Diet: routine diet  Activity: Advance as tolerated. Pelvic rest for 6 weeks.   Outpatient follow up:6 weeks Follow up Appt:No future appointments. Follow up Visit:No Follow-up on file.  Postpartum contraception: Undecided  Newborn Data: Live born female "Julia Santos" Birth Weight: 6 lb 8.4 oz (2960 g) APGAR: 9, 9  Baby Feeding: Breast Disposition:rooming in   03/03/2016 Laury Deep, Jerilynn Mages, CNM

## 2016-03-03 NOTE — Progress Notes (Signed)
Patient ID: Julia Santos, female   DOB: August 03, 1980, 36 y.o.   MRN: [id number] Post Partum Day #2 with 1st Degree Perineal Laceration           Information for the patient's newborn:  Paw, [place] Girl Evi [id number]  female  "Ernestine" Feeding: breast  Subjective: No HA, SOB, CP, F/C, breast symptoms. Pain well-managed with ibuprofen. Normal vaginal bleeding, no clots.      Objective:  Temp:  [97.5 F (36.4 C)-98 F (36.7 C)] 97.5 F (36.4 C) (02/24 0627) Pulse Rate:  [66-80] 66 (02/24 0627) Resp:  [18] 18 (02/24 0627) BP: (100-118)/(65-74) 100/74 (02/24 0627)    Recent Labs  02/29/16 1405 03/02/16 0520  WBC 10.3 10.6*  HGB 12.6 9.4*  HCT 34.7* 25.6*  PLT 293 207    Blood type: --/--/A POS (02/21 1422) Rubella: Immune (08/01 0000)    Physical Exam:  General: alert, cooperative and no distress Uterine Fundus: firm Lochia: appropriate Perineum: repair 1st degree repair healing well, edema none DVT Evaluation: No evidence of DVT seen on physical exam. Negative Homan's sign. No cords or calf tenderness. Calf/Ankle edema is present.    Assessment/Plan: PPD # 2 / 36 y.o., [id number] S/P: induced vaginal   Principal Problem:   Postpartum care following vaginal delivery (2/22) Active Problems:   SVD (spontaneous vaginal delivery)   Maternal anemia, with delivery   normal postpartum exam  Continue current postpartum care  D/C home - remain in-house with baby patient   LOS: 3 days   Laury Deep, M, MSN, CNM 03/03/2016, 8:13 AM

## 2016-03-03 NOTE — Lactation Note (Addendum)
This note was copied from a baby's chart. Lactation Consultation Note  Patient Name: Julia Santos [id number] Date: 03/03/2016 Reason for consult: Follow-up assessment  Baby on phototherapy.  < 6 lbs. 6 hours old. Mother states she is having trouble latching on R side.  Explained it can be a work in progress sometimes. Provided her with a hand pump and suggest she prepump on R side before latching. Mother has good flow of colostrum and is also post pumping and giving extra volume back to baby after breastfeeding. Attempted latching on R side after prepumping and baby sucked a few times and stopped. Switched to L side and baby easily latches with sucks and swallows observed. Applied #20NS on R side and baby was able to sustain latch. Mother felt nipple was pinching so instructed mother to keep baby close to breast. Continue to post pump and give volume back to baby. Mom encouraged to feed baby 8-12 times/24 hours and with feeding cues.  Call if she needs further assistance.  Mother has good positioning with breastfeeding.    Maternal Data    Feeding Feeding Type: Breast Fed  LATCH Score/Interventions Latch: Grasps breast easily, tongue down, lips flanged, rhythmical sucking. (on Left side, not on right) Intervention(s): Breast massage  Audible Swallowing: Spontaneous and intermittent  Type of Nipple: Everted at rest and after stimulation  Comfort (Breast/Nipple): Soft / non-tender     Hold (Positioning): Assistance needed to correctly position infant at breast and maintain latch.  LATCH Score: 9  Lactation Tools Discussed/Used     Consult Status Consult Status: Follow-up Date: 03/04/16 Follow-up type: In-patient    Vivianne Master [facility] 03/03/2016, 9:08 AM

## 2016-03-04 ENCOUNTER — Ambulatory Visit: Payer: Self-pay

## 2016-03-04 NOTE — Lactation Note (Signed)
This note was copied from a baby's chart. Lactation Consultation Note  Baby 51 hours old.  11% weight loss. Mother is only breastfeeding on L side due to difficult latch on R and pain w/ R side breastfeeding. Mother has been post pumping q3 hr with gradually increasing volume.  Last pumping session she pumped 66ml. Suggest parents consider supplementing with Alimentum after feedings.  Parents agreeable. Taught parents how to finger syringe feed and gave them range of 18-25 based on infant's age. Reviewed volume guidelines increasing per day of life. Mother will post pump approx 6-7 times a day after breastfeeding.  Suggest she continue to try breastfeeding on L side. Suggest she call for latch help with next breastfeeding session.    Patient Name: Julia Santos [id number] Date: 03/04/2016     Maternal Data    Feeding Feeding Type: Breast Fed Length of feed: 10 min  LATCH Score/Interventions                      Lactation Tools Discussed/Used     Consult Status      Carlye Grippe 03/04/2016, 9:15 AM

## 2016-03-08 ENCOUNTER — Inpatient Hospital Stay (HOSPITAL_COMMUNITY): Admission: RE | Admit: 2016-03-08 | Payer: Commercial Managed Care - PPO | Source: Ambulatory Visit

## 2016-03-15 ENCOUNTER — Encounter (HOSPITAL_COMMUNITY): Payer: Self-pay | Admitting: *Deleted

## 2016-03-15 ENCOUNTER — Ambulatory Visit (HOSPITAL_COMMUNITY)
Admission: AD | Admit: 2016-03-15 | Discharge: 2016-03-16 | Disposition: A | Discharge status: Home / Self Care | Payer: Commercial Managed Care - PPO | Source: Ambulatory Visit | Attending: Obstetrics and Gynecology | Admitting: Obstetrics and Gynecology

## 2016-03-15 ENCOUNTER — Inpatient Hospital Stay (HOSPITAL_COMMUNITY): Payer: Commercial Managed Care - PPO

## 2016-03-15 DIAGNOSIS — O9902 Anemia complicating childbirth: Secondary | ICD-10-CM

## 2016-03-15 LAB — CBC
HEMATOCRIT: 38.5 % (ref 36.0–46.0)
Hemoglobin: 13.6 g/dL (ref 12.0–15.0)
MCH: 34.3 pg — AB (ref 26.0–34.0)
MCHC: 35.3 g/dL (ref 30.0–36.0)
MCV: 97.2 fL (ref 78.0–100.0)
Platelets: 404 10*3/uL — ABNORMAL HIGH (ref 150–400)
RBC: 3.96 MIL/uL (ref 3.87–5.11)
RDW: 11.4 % — ABNORMAL LOW (ref 11.5–15.5)
WBC: 9.2 10*3/uL (ref 4.0–10.5)

## 2016-03-15 MED ORDER — DEXAMETHASONE SODIUM PHOSPHATE 10 MG/ML IJ SOLN
INTRAMUSCULAR | Status: AC
  Filled 2016-03-15: qty 1

## 2016-03-15 MED ORDER — ONDANSETRON HCL 4 MG/2ML IJ SOLN
INTRAMUSCULAR | Status: AC
  Filled 2016-03-15: qty 2

## 2016-03-15 MED ORDER — MIDAZOLAM HCL 2 MG/2ML IJ SOLN
INTRAMUSCULAR | Status: AC
  Filled 2016-03-15: qty 2

## 2016-03-15 MED ORDER — CEFAZOLIN SODIUM-DEXTROSE 2-4 GM/100ML-% IV SOLN
2.0000 g | INTRAVENOUS | Status: AC
Start: 2016-03-16 — End: 2016-03-16
  Administered 2016-03-16: 2 g via INTRAVENOUS
  Filled 2016-03-15: qty 100

## 2016-03-15 MED ORDER — METHYLERGONOVINE MALEATE 0.2 MG/ML IJ SOLN
0.2000 mg | Freq: Once | INTRAMUSCULAR | Status: AC
  Administered 2016-03-15: 0.2 mg via INTRAMUSCULAR
  Filled 2016-03-15: qty 1

## 2016-03-15 MED ORDER — PROPOFOL 10 MG/ML IV BOLUS
INTRAVENOUS | Status: AC
  Filled 2016-03-15: qty 20

## 2016-03-15 MED ORDER — SCOPOLAMINE 1 MG/3DAYS TD PT72
MEDICATED_PATCH | TRANSDERMAL | Status: AC
  Filled 2016-03-15: qty 1

## 2016-03-15 MED ORDER — FAMOTIDINE IN NACL 20-0.9 MG/50ML-% IV SOLN
20.0000 mg | Freq: Once | INTRAVENOUS | Status: AC
  Administered 2016-03-16: 20 mg via INTRAVENOUS
  Filled 2016-03-15: qty 50

## 2016-03-15 MED ORDER — SOD CITRATE-CITRIC ACID 500-334 MG/5ML PO SOLN
30.0000 mL | Freq: Once | ORAL | Status: AC
  Administered 2016-03-16: 30 mL via ORAL
  Filled 2016-03-15: qty 15

## 2016-03-15 MED ORDER — FENTANYL CITRATE (PF) 100 MCG/2ML IJ SOLN
INTRAMUSCULAR | Status: AC
  Filled 2016-03-15: qty 2

## 2016-03-15 MED ORDER — KETOROLAC TROMETHAMINE 30 MG/ML IJ SOLN
INTRAMUSCULAR | Status: AC
  Filled 2016-03-15: qty 2

## 2016-03-15 MED ORDER — LIDOCAINE HCL (CARDIAC) 20 MG/ML IV SOLN
INTRAVENOUS | Status: AC
  Filled 2016-03-15: qty 5

## 2016-03-15 NOTE — MAU Note (Signed)
Pt presents complaining of heavy vaginal bleeding. She is 2 weeks postpartum from a vaginal delivery. Passing clots. Some cramping.

## 2016-03-15 NOTE — Progress Notes (Signed)
Notified of pt arrival in MAU and complaint. Will come see pt

## 2016-03-15 NOTE — Progress Notes (Signed)
sono reviewed and disc with pt:  US Pelvis Complete  Result Date: 03/15/2016 CLINICAL DATA:  Heavy bleeding with vaginal clots EXAM: TRANSABDOMINAL ULTRASOUND OF PELVIS TECHNIQUE: Transabdominal ultrasound examination of the pelvis was performed including evaluation of the uterus, ovaries, adnexal regions, and pelvic cul-de-sac. COMPARISON:  None. FINDINGS: Uterus Measurements: 11.7 x 7 x 8.5 cm. No fibroids or other mass visualized. Endometrium Thickness: 27 mm. Heterogenous in echotexture with focal echogenic area in the fundus measuring 1.5 x 1.1 x 2.1 cm. Slight increased endometrial vascularity. Right ovary Measurements: 1.7 x 1.6 x 1.6 cm. Normal appearance/no adnexal mass. Left ovary Measurements: 2.7 x 1.4 x 2.6 cm. Normal appearance/no adnexal mass. Other findings:  No abnormal free fluid. IMPRESSION: Heterogenous thickened endometrium up to 27 mm with focal echogenic mass at the fundus measuring 2.1 cm, findings could be consistent with retained products of conception in the correct clinical setting. Electronically Signed   By: Donavan Foil M.D.   On: 03/15/2016 22:39   CBC    Component Value Date/Time   WBC 9.2 03/15/2016 2131   RBC 3.96 03/15/2016 2131   HGB 13.6 03/15/2016 2131   HCT 38.5 03/15/2016 2131   PLT 404 (H) 03/15/2016 2131   MCV 97.2 03/15/2016 2131   MCH 34.3 (H) 03/15/2016 2131   MCHC 35.3 03/15/2016 2131   RDW 11.4 (L) 03/15/2016 2131   ImP: retained tissue Postpartum bleeding P) suction D&C ( u/s guided). Risk of surgery reviewed including infection, bleeding, uterine perforation, injury to surrounding organ structure, internal scar tissue. All ? Answered. Consent signed. OR notified

## 2016-03-15 NOTE — MAU Note (Signed)
ON ARRIVAL  HAS  RED  VAG  BLOOD  ON PERINEUM  AND  THIGHS   -   SAYS  SHE  DEL  VAG ON 2-22   THEN WENT  HOME   ON 2-25    BLEEDING  HAS  BEEN NL  UNTIL  3-5 ON Monday       CRAMPING  AND  CLOTS   STARTED  TODAY

## 2016-03-15 NOTE — MAU Provider Note (Signed)
History     Chief Complaint  Patient presents with  . Vaginal Bleeding  36 yo G4P1 MWF s/p SVD 2/22 presents with c/o passing large clots since this afternoon and passage a large 'something" in the lobby bathroom that did not look like a clot. Pt denies lightheadedness or fatigue.   Has been BRF and bottlefeeding. Placenta was sent to pathology and was intact   Pertinent Gynecological History: SVD 2/22 AB x 3  Past Medical History:  Diagnosis Date  . Anticardiolipin antibody positive   . 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 Vaginal Pap smear, abnormal     Past Surgical History:  Procedure Laterality Date  . NO PAST SURGERIES      Family History  Problem Relation Age of Onset  . Hypertension Father   . Esophageal cancer Maternal Uncle   . COPD Paternal 20   . COPD Maternal Grandfather   . Diabetes Maternal Grandfather     Social History  Substance Use Topics  . Smoking status: Never Smoker  . Smokeless tobacco: Never Used  . Alcohol use No    Allergies: No Known Allergies  Prescriptions Prior to Admission  Medication Sig Dispense Refill Last Dose  . aspirin 81 MG chewable tablet Chew 81 mg by mouth daily.   03/15/2016 at Unknown time  . Docosahexaenoic Acid (DHA COMPLETE PO) Take 1 tablet by mouth daily.   03/14/2016 at Unknown time  . folic acid (FOLVITE) 1 MG tablet Take 1 mg by mouth daily.   03/14/2016 at Unknown time  . ibuprofen (ADVIL,MOTRIN) 600 MG tablet Take 1 tablet (600 mg total) by mouth every 6 (six) hours. 30 tablet 0 03/14/2016 at Unknown time  . iron polysaccharides (NIFEREX) 150 MG capsule Take 1 capsule (150 mg total) by mouth daily. 30 capsule 3 03/14/2016 at Unknown time  . magnesium oxide (MAG-OX) 400 MG tablet Take 400 mg by mouth daily.   03/14/2016 at Unknown time  . Prenatal Vit-Fe Fumarate-FA (PRENATAL MULTIVITAMIN) TABS tablet Take 1 tablet by mouth daily at 12 noon.   03/14/2016 at Unknown time     Physical Exam   Blood pressure 128/81,  pulse 110, temperature 98.2 F (36.8 C), temperature source Oral, resp. rate 18, unknown if currently breastfeeding.  General appearance: alert, cooperative and no distress Lungs: clear to auscultation bilaterally Heart: regular rate and rhythm, S1, S2 normal, no murmur, click, rub or gallop Abdomen: soft, non-tender; bowel sounds normal; no masses,  no organomegaly Pelvic: external genitalia normal and small amount of blood in the vault, cervix parous FT dilated, uterus AV 12-13 weeks size nontender, adnexa no palp mass Extremities: no edema, redness or tenderness in the calves or thighs Skin: Skin color, texture, turgor normal. No rashes or lesions ED Course  IMP: postpartum bleeding ? Due to retained placental tissue vs subinvolution P) pelvic sonogram. CBC. Methergine now Advised if retained POC will need D&C MDM   Bartlett Enke A, MD 8:55 PM 03/15/2016

## 2016-03-16 ENCOUNTER — Ambulatory Visit (HOSPITAL_COMMUNITY): Payer: Commercial Managed Care - PPO

## 2016-03-16 ENCOUNTER — Inpatient Hospital Stay (HOSPITAL_COMMUNITY): Payer: Commercial Managed Care - PPO | Admitting: Anesthesiology

## 2016-03-16 ENCOUNTER — Encounter (HOSPITAL_COMMUNITY)
Admission: AD | Disposition: A | Discharge status: Home / Self Care | Payer: Self-pay | Source: Ambulatory Visit | Attending: Obstetrics and Gynecology

## 2016-03-16 HISTORY — PX: DILATION AND EVACUATION: SHX1459

## 2016-03-16 SURGERY — DILATION AND EVACUATION, UTERUS
Anesthesia: General | Site: Uterus

## 2016-03-16 MED ORDER — ONDANSETRON HCL 4 MG/2ML IJ SOLN
INTRAMUSCULAR | Status: DC | PRN
  Administered 2016-03-16: 4 mg via INTRAVENOUS

## 2016-03-16 MED ORDER — LACTATED RINGERS IV SOLN
INTRAVENOUS | Status: DC | PRN
  Administered 2016-03-16 (×2): via INTRAVENOUS

## 2016-03-16 MED ORDER — MIDAZOLAM HCL 2 MG/2ML IJ SOLN: 0.5000 mg | Freq: Once | INTRAMUSCULAR | Status: DC | PRN

## 2016-03-16 MED ORDER — METHYLERGONOVINE MALEATE 0.2 MG/ML IJ SOLN
INTRAMUSCULAR | Status: AC
  Filled 2016-03-16: qty 1

## 2016-03-16 MED ORDER — METHYLERGONOVINE MALEATE 0.2 MG/ML IJ SOLN
INTRAMUSCULAR | Status: DC | PRN
  Administered 2016-03-16: 0.2 mg via INTRAMUSCULAR

## 2016-03-16 MED ORDER — PROMETHAZINE HCL 25 MG/ML IJ SOLN: 6.2500 mg | INTRAMUSCULAR | Status: DC | PRN

## 2016-03-16 MED ORDER — PROPOFOL 10 MG/ML IV BOLUS
INTRAVENOUS | Status: DC | PRN
  Administered 2016-03-16: 200 mg via INTRAVENOUS

## 2016-03-16 MED ORDER — FENTANYL CITRATE (PF) 100 MCG/2ML IJ SOLN
INTRAMUSCULAR | Status: DC | PRN
  Administered 2016-03-16 (×2): 50 ug via INTRAVENOUS

## 2016-03-16 MED ORDER — KETOROLAC TROMETHAMINE 30 MG/ML IJ SOLN
INTRAMUSCULAR | Status: DC | PRN
  Administered 2016-03-16: 30 mg via INTRAVENOUS

## 2016-03-16 MED ORDER — SCOPOLAMINE 1 MG/3DAYS TD PT72
MEDICATED_PATCH | TRANSDERMAL | Status: DC | PRN
  Administered 2016-03-16: 1 via TRANSDERMAL

## 2016-03-16 MED ORDER — FENTANYL CITRATE (PF) 100 MCG/2ML IJ SOLN: 25.0000 ug | INTRAMUSCULAR | Status: DC | PRN

## 2016-03-16 MED ORDER — DEXAMETHASONE SODIUM PHOSPHATE 10 MG/ML IJ SOLN
INTRAMUSCULAR | Status: DC | PRN
  Administered 2016-03-16: 10 mg via INTRAVENOUS

## 2016-03-16 MED ORDER — 0.9 % SODIUM CHLORIDE (POUR BTL) OPTIME
TOPICAL | Status: DC | PRN
  Administered 2016-03-16: 1000 mL

## 2016-03-16 MED ORDER — MEPERIDINE HCL 25 MG/ML IJ SOLN: 6.2500 mg | INTRAMUSCULAR | Status: DC | PRN

## 2016-03-16 MED ORDER — SUCCINYLCHOLINE CHLORIDE 20 MG/ML IJ SOLN
INTRAMUSCULAR | Status: DC | PRN
  Administered 2016-03-16: 120 mg via INTRAVENOUS

## 2016-03-16 MED ORDER — KETOROLAC TROMETHAMINE 30 MG/ML IJ SOLN
INTRAMUSCULAR | Status: DC | PRN
  Administered 2016-03-16: 30 mg via INTRAMUSCULAR

## 2016-03-16 MED ORDER — MIDAZOLAM HCL 5 MG/5ML IJ SOLN
INTRAMUSCULAR | Status: DC | PRN
  Administered 2016-03-16: 2 mg via INTRAVENOUS

## 2016-03-16 SURGICAL SUPPLY — 19 items
CATH ROBINSON RED A/P 16FR (CATHETERS) ×2 IMPLANT
CLOTH BEACON ORANGE TIMEOUT ST (SAFETY) ×2 IMPLANT
DECANTER SPIKE VIAL GLASS SM (MISCELLANEOUS) ×2 IMPLANT
GLOVE BIOGEL PI IND STRL 7.0 (GLOVE) ×2 IMPLANT
GLOVE BIOGEL PI INDICATOR 7.0 (GLOVE) ×2
GLOVE ECLIPSE 6.5 STRL STRAW (GLOVE) ×2 IMPLANT
GOWN STRL REUS W/TWL LRG LVL3 (GOWN DISPOSABLE) ×4 IMPLANT
KIT BERKELEY 1ST TRIMESTER 3/8 (MISCELLANEOUS) ×2 IMPLANT
NS IRRIG 1000ML POUR BTL (IV SOLUTION) ×2 IMPLANT
PACK VAGINAL MINOR WOMEN LF (CUSTOM PROCEDURE TRAY) ×2 IMPLANT
PAD OB MATERNITY 4.3X12.25 (PERSONAL CARE ITEMS) ×2 IMPLANT
PAD PREP 24X48 CUFFED NSTRL (MISCELLANEOUS) ×2 IMPLANT
SET BERKELEY SUCTION TUBING (SUCTIONS) ×2 IMPLANT
TOWEL OR 17X24 6PK STRL BLUE (TOWEL DISPOSABLE) ×4 IMPLANT
VACURETTE 10 RIGID CVD (CANNULA) IMPLANT
VACURETTE 12 RIGID CVD (CANNULA) ×2 IMPLANT
VACURETTE 7MM CVD STRL WRAP (CANNULA) IMPLANT
VACURETTE 8 RIGID CVD (CANNULA) IMPLANT
VACURETTE 9 RIGID CVD (CANNULA) IMPLANT

## 2016-03-16 NOTE — Anesthesia Preprocedure Evaluation (Addendum)
Anesthesia Evaluation  Patient identified by MRN, date of birth, ID band Patient awake    Reviewed: Allergy & Precautions, NPO status , Patient's Chart, lab work & pertinent test results  History of Anesthesia Complications Negative for: history of anesthetic complications  Airway Mallampati: II  TM Distance: >3 FB Neck ROM: Full    Dental  (+) Dental Advisory Given   Pulmonary neg pulmonary ROS,    breath sounds clear to auscultation       Cardiovascular negative cardio ROS   Rhythm:Regular Rate:Normal     Neuro/Psych negative neurological ROS     GI/Hepatic negative GI ROS, Neg liver ROS,   Endo/Other  negative endocrine ROS  Renal/GU negative Renal ROS     Musculoskeletal   Abdominal   Peds  Hematology  (+) Blood dyscrasia (anticardiolipin antibody positive), , Hb 13.6, plt 404k   Anesthesia Other Findings   Reproductive/Obstetrics Nursing her 108 week old                            Anesthesia Physical Anesthesia Plan  ASA: II  Anesthesia Plan: General   Post-op Pain Management:    Induction: Intravenous and Rapid sequence  Airway Management Planned: Oral ETT  Additional Equipment:   Intra-op Plan:   Post-operative Plan: Extubation in OR  Informed Consent: I have reviewed the patients History and Physical, chart, labs and discussed the procedure including the risks, benefits and alternatives for the proposed anesthesia with the patient or authorized representative who has indicated his/her understanding and acceptance.   Dental advisory given  Plan Discussed with: CRNA and Surgeon  Anesthesia Plan Comments: (Plan routine monitors, GETA)        Anesthesia Quick Evaluation

## 2016-03-16 NOTE — Op Note (Signed)
NAMELEETA, GRIMME                   ACCOUNT NO.:  [id number]  MEDICAL RECORD NO.:  [id number]  LOCATION:                                 FACILITY:  PHYSICIAN:  Servando Salina, M.D.    DATE OF BIRTH:  DATE OF PROCEDURE:  03/16/2016 DATE OF DISCHARGE:                              OPERATIVE REPORT   PREOPERATIVE DIAGNOSES:  Postpartum bleeding, retained  placenta tissue.  PROCEDURE:  Ultrasound-guided suction, dilation, and curettage.  POSTOPERATIVE DIAGNOSES:  Postpartum bleeding, retained  placenta tissue.  ANESTHESIA:  General.  SURGEON:  Servando Salina, M.D.  ASSISTANT:  None.  DESCRIPTION OF PROCEDURE:  Under adequate general anesthesia, the patient was placed in the dorsal lithotomy position.  She was sterilely prepped and draped in the usual fashion.  The bladder was not catheterized due to planned ultrasound.  Examination under anesthesia revealed a 1-cm dilated cervix.  Uterus was about 13-week size.  Bivalve speculum was placed in vagina.  Single-tooth tenaculum was placed on anterior lip of the cervix.  The cervix easily accepted a large dilator and using a #12 suction cannula, the uterine cavity was entered under ultrasound guidance.  Tissue which  was noted with blood flow was removed with suction.  The cavity was then gently curetted.  When all tissue was found to have been removed, all instruments were then removed from the vagina.  SPECIMEN:  Labeled retained placental tissue was sent to Pathology.  ESTIMATED BLOOD LOSS:  20 mL.  COMPLICATION:  None.  The patient tolerated the procedure well and was transferred to recovery in stable condition.     Servando Salina, M.D.   ______________________________ Servando Salina, M.D.    [place]/[facility]  D:  03/16/2016  T:  03/16/2016  Job:  [id number]

## 2016-03-16 NOTE — Anesthesia Postprocedure Evaluation (Addendum)
Anesthesia Post Note  Patient: Julia Santos  Procedure(s) Performed: Procedure(s) (LRB): Ultrasound guided DILATATION AND EVACUATION (N/A)  Patient location during evaluation: PACU Anesthesia Type: General Level of consciousness: awake and alert, oriented and patient cooperative Pain management: pain level controlled Vital Signs Assessment: post-procedure vital signs reviewed and stable Respiratory status: spontaneous breathing, nonlabored ventilation and respiratory function stable Cardiovascular status: blood pressure returned to baseline and stable Postop Assessment: no signs of nausea or vomiting Anesthetic complications: no        Last Vitals:  Vitals:   03/16/16 0230 03/16/16 0245  BP: 109/64 109/74  Pulse: 80 82  Resp: 15 17  Temp:  36.9 C    Last Pain:  Vitals:   03/16/16 0245  TempSrc:   PainSc: 0-No pain   Pain Goal:                 Ethelmae Ringel,E. Rishi Vicario

## 2016-03-16 NOTE — Brief Op Note (Signed)
03/15/2016 - 03/16/2016  1:33 AM  PATIENT:  Julia Santos  36 y.o. female  PRE-OPERATIVE DIAGNOSIS:  retained placental products, postpartum bleeding  POST-OPERATIVE DIAGNOSIS:  same  PROCEDURE:  Ultrasound guided suction dilation and curettage  SURGEON:  Surgeon(s) and Role:    * Servando Salina, MD - Primary  PHYSICIAN ASSISTANT:   ASSISTANTS: none   ANESTHESIA:   general Findings: enlarged uterus c/w postpartum stated. Thickened endometrium with flow, dilated cervix EBL:  Total I/O In: 400 [I.V.:400] Out: 1020 [Urine:1000; Blood:20]  BLOOD ADMINISTERED:none  DRAINS: none   LOCAL MEDICATIONS USED:  NONE  SPECIMEN:  Source of Specimen:  placental product  DISPOSITION OF SPECIMEN:  PATHOLOGY  COUNTS:  YES  TOURNIQUET:  * No tourniquets in log *  DICTATION: .Other Dictation: Dictation Number [id number]  PLAN OF CARE: Discharge to home after PACU  PATIENT DISPOSITION:  PACU - hemodynamically stable.   Delay start of Pharmacological VTE agent (>24hrs) due to surgical blood loss or risk of bleeding: no

## 2016-03-16 NOTE — Discharge Instructions (Signed)
DISCHARGE INSTRUCTIONS: D&C / D&E The following instructions have been prepared to help you care for yourself upon your return home.   Personal hygiene:  Use sanitary pads for vaginal drainage, not tampons.  Shower the day after your procedure.  NO tub baths, pools or Jacuzzis for 2-3 weeks.  Wipe front to back after using the bathroom.  Activity and limitations:  Do NOT drive or operate any equipment for 24 hours. The effects of anesthesia are still present and drowsiness may result.  Do NOT rest in bed all day.  Walking is encouraged.  Walk up and down stairs slowly.  You may resume your normal activity in one to two days or as indicated by your physician.  Sexual activity: NO intercourse for at least 2 weeks after the procedure, or as indicated by your physician.  Diet: Eat a light meal as desired this evening. You may resume your usual diet tomorrow.  Return to work: You may resume your work activities in one to two days or as indicated by your doctor.  What to expect after your surgery: Expect to have vaginal bleeding/discharge for 2-3 days and spotting for up to 10 days. It is not unusual to have soreness for up to 1-2 weeks. You may have a slight burning sensation when you urinate for the first day. Mild cramps may continue for a couple of days. You may have a regular period in 2-6 weeks.  Call your doctor for any of the following:  Excessive vaginal bleeding, saturating and changing one pad every hour.  Inability to urinate 6 hours after discharge from hospital.  Pain not relieved by pain medication.  Fever of 100.4 F or greater.  Unusual vaginal discharge or odor.   Call for an appointment:    Patients signature: ______________________  Nurses signature ________________________  Support person's signature_______________________    Post Anesthesia Home Care Instructions  Activity: Get plenty of rest for the remainder of the day. A responsible  adult should stay with you for 24 hours following the procedure.  For the next 24 hours, DO NOT: -Drive a car -Paediatric nurse -Drink alcoholic beverages -Take any medication unless instructed by your physician -Make any legal decisions or sign important papers.  Meals: Start with liquid foods such as gelatin or soup. Progress to regular foods as tolerated. Avoid greasy, spicy, heavy foods. If nausea and/or vomiting occur, drink only clear liquids until the nausea and/or vomiting subsides. Call your physician if vomiting continues.  Special Instructions/Symptoms: Your throat may feel dry or sore from the anesthesia or the breathing tube placed in your throat during surgery. If this causes discomfort, gargle with warm salt water. The discomfort should disappear within 24 hours.  If you had a scopolamine patch placed behind your ear for the management of post- operative nausea and/or vomiting:  1. The medication in the patch is effective for 72 hours, after which it should be removed.  Wrap patch in a tissue and discard in the trash. Wash hands thoroughly with soap and water. 2. You may remove the patch earlier than 72 hours if you experience unpleasant side effects which may include dry mouth, dizziness or visual disturbances. 3. Avoid touching the patch. Wash your hands with soap and water after contact with the patch.   Resume postpartum instructions

## 2016-03-16 NOTE — Transfer of Care (Signed)
Immediate Anesthesia Transfer of Care Note  Patient: Julia Santos  Procedure(s) Performed: Procedure(s): Ultrasound guided DILATATION AND EVACUATION (N/A)  Patient Location: PACU  Anesthesia Type:General  Level of Consciousness: awake, alert  and oriented  Airway & Oxygen Therapy: Patient Spontanous Breathing and Patient connected to nasal cannula oxygen  Post-op Assessment: Report given to RN and Post -op Vital signs reviewed and stable  Post vital signs: Reviewed and stable  Last Vitals:  03/16/2016 0140 BP 120/88 HR 112 Resp 20 O2 sat 100% Temp 98.9 orally  Last Pain:  Vitals:   03/15/16 1951  TempSrc:   PainSc: 1          Complications: No apparent anesthesia complications

## 2016-03-16 NOTE — Anesthesia Procedure Notes (Signed)
Procedure Name: Intubation Date/Time: 03/16/2016 1:08 AM Performed by: Riki Sheer Pre-anesthesia Checklist: Patient identified, Emergency Drugs available, Suction available, Patient being monitored and Timeout performed Patient Re-evaluated:Patient Re-evaluated prior to inductionOxygen Delivery Method: Circle system utilized Preoxygenation: Pre-oxygenation with 100% oxygen Intubation Type: IV induction, Rapid sequence and Cricoid Pressure applied Laryngoscope Size: Miller and 2 Grade View: Grade I Tube type: Oral Tube size: 7.0 mm Number of attempts: 1 Airway Equipment and Method: Stylet Placement Confirmation: ETT inserted through vocal cords under direct vision,  positive ETCO2,  CO2 detector and breath sounds checked- equal and bilateral Secured at: 21 cm Tube secured with: Tape Dental Injury: Teeth and Oropharynx as per pre-operative assessment

## 2016-03-19 ENCOUNTER — Encounter (HOSPITAL_COMMUNITY): Payer: Self-pay | Admitting: Obstetrics and Gynecology

## 2016-04-16 ENCOUNTER — Encounter: Payer: Self-pay | Admitting: Obstetrics & Gynecology

## 2016-04-16 ENCOUNTER — Ambulatory Visit (INDEPENDENT_AMBULATORY_CARE_PROVIDER_SITE_OTHER): Payer: Commercial Managed Care - PPO | Admitting: Obstetrics & Gynecology

## 2016-04-16 DIAGNOSIS — D473 Essential (hemorrhagic) thrombocythemia: Secondary | ICD-10-CM

## 2016-04-16 DIAGNOSIS — Z1151 Encounter for screening for human papillomavirus (HPV): Secondary | ICD-10-CM

## 2016-04-16 DIAGNOSIS — R7989 Other specified abnormal findings of blood chemistry: Secondary | ICD-10-CM

## 2016-04-16 DIAGNOSIS — Z124 Encounter for screening for malignant neoplasm of cervix: Secondary | ICD-10-CM

## 2016-04-16 LAB — CBC
HEMATOCRIT: 37.7 % (ref 35.0–45.0)
Hemoglobin: 12.8 g/dL (ref 11.7–15.5)
MCH: 32.9 pg (ref 27.0–33.0)
MCHC: 34 g/dL (ref 32.0–36.0)
MCV: 96.9 fL (ref 80.0–100.0)
MPV: 8.3 fL (ref 7.5–12.5)
Platelets: 375 10*3/uL (ref 140–400)
RBC: 3.89 MIL/uL (ref 3.80–5.10)
RDW: 11.6 % (ref 11.0–15.0)
WBC: 5.7 10*3/uL (ref 3.8–10.8)

## 2016-04-16 NOTE — Addendum Note (Signed)
Addended by: Thurnell Garbe A on: 04/16/2016 04:52 PM   Modules accepted: Orders

## 2016-04-16 NOTE — Progress Notes (Signed)
     Julia Santos January 02, 1981 [id number]  PP 6+ wks SVD/Breastfeeding/BB girl  S:  Pt reports feeling well.  Had a D+C for Retained POC 03/2015.  Worried that her Plts were increased at 404 at that time.  No vaginal bleeding anymore.  No pelvic pain.  No fever.  No sexual activity x delivery.  O:  A & O x 3 Vitals:   04/16/16 1405  BP: 128/84    LABS: 03/2015 Lab Results  Component Value Date   WBC 9.2 03/15/2016   HGB 13.6 03/15/2016   HCT 38.5 03/15/2016   MCV 97.2 03/15/2016   PLT 404 (H) 03/15/2016      Lungs: clear  Heart: rcr  Abdomen: soft  Perineum: intact  Uterus AV normal volume, nontender.  No adnexal mass.  Cervix/vagina/vulva normal.  Pap done.  Extremities: normal    A/P: PP 6+ wks/ [id number]  Doing very well, breastfeeding.             Declines contraception, will use condoms, but ok if conceives.  1. Postpartum care following vaginal delivery Pap/HPV HR done.  2. High platelet count (HCC) Repeat CBC today.  Counseling on causes and reassured.  >50% 10 min.

## 2016-04-17 ENCOUNTER — Encounter: Payer: Self-pay | Admitting: Obstetrics & Gynecology

## 2016-04-19 ENCOUNTER — Encounter: Payer: Self-pay | Admitting: Obstetrics & Gynecology

## 2016-04-19 LAB — PAP, TP IMAGING W/ HPV RNA, RFLX HPV TYPE 16,18/45: HPV MRNA, HIGH RISK: NOT DETECTED

## 2018-02-03 IMAGING — US US PELVIS COMPLETE
1 series · 15 of 25 positions shown · non-contrast
Comparison: None.

CLINICAL DATA: Heavy bleeding with vaginal clots

EXAM:
TRANSABDOMINAL ULTRASOUND OF PELVIS
TECHNIQUE: Transabdominal ultrasound examination of the pelvis was performed
including evaluation of the uterus, ovaries, adnexal regions, and
pelvic cul-de-sac.

[Series 1: us pelvis complete · 15 of 48 slices shown]
[im 1/48]
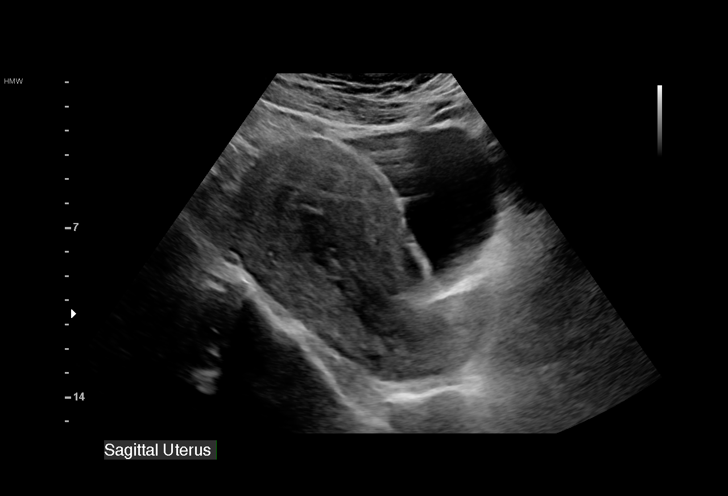
[im 4/48]
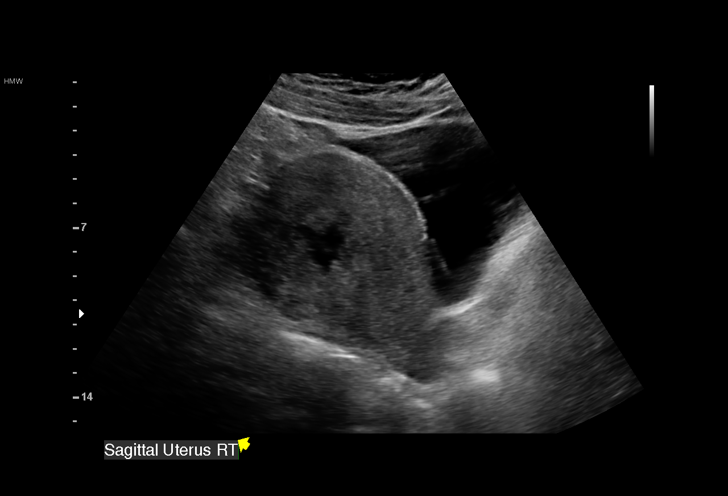
[im 8/48]
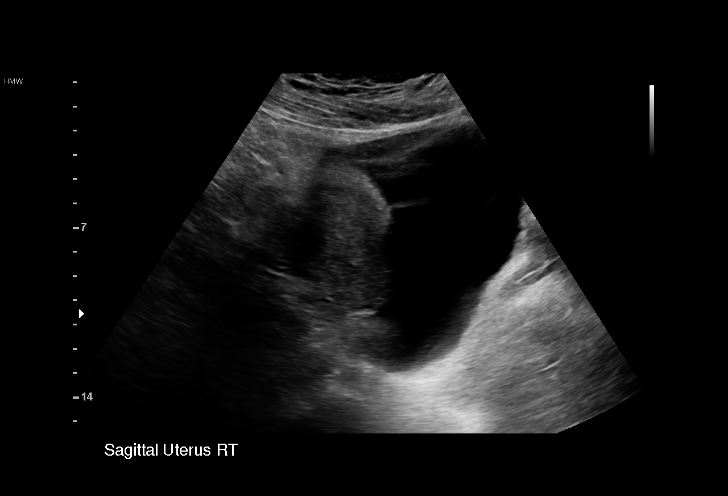
[im 10/48]
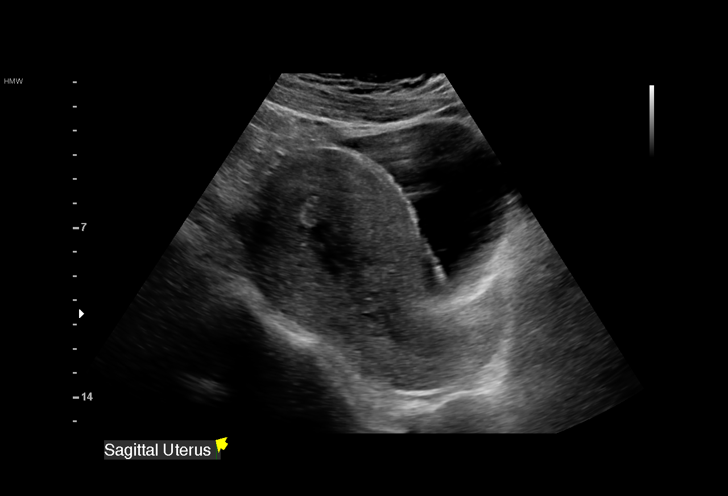
[im 14/48]
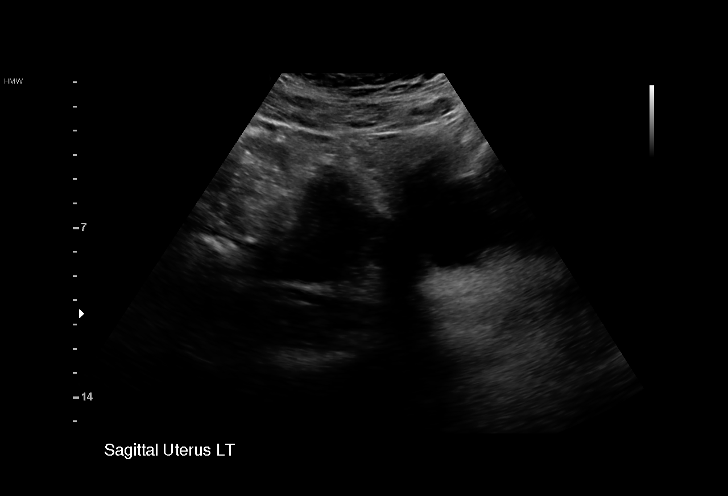
[im 18/48]
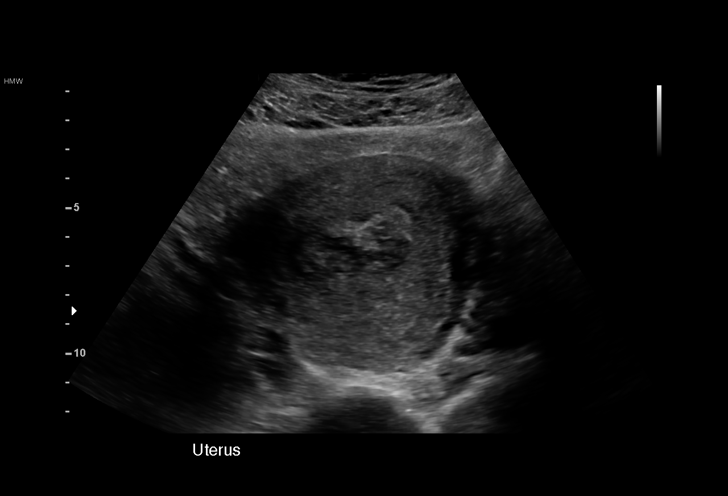
[im 20/48]
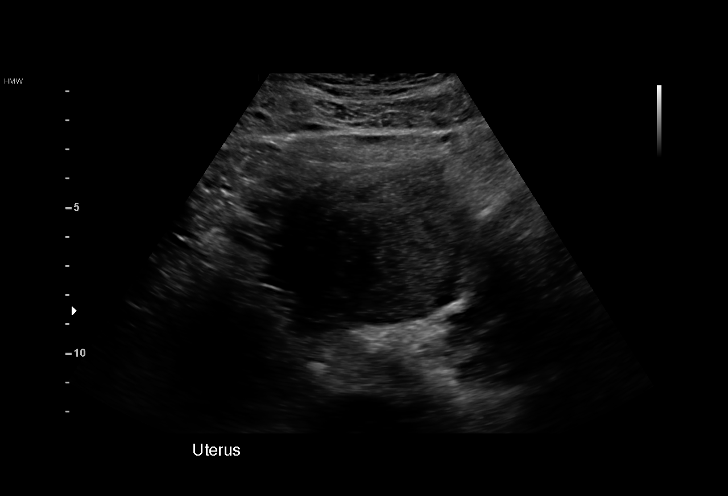
[im 24/48]
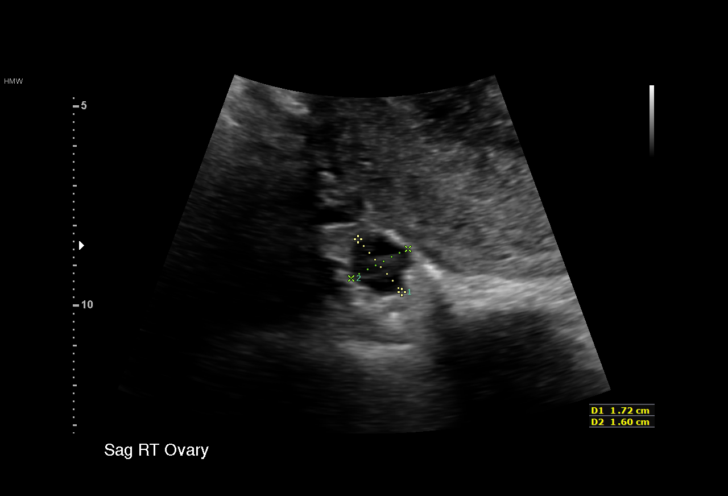
[im 28/48]
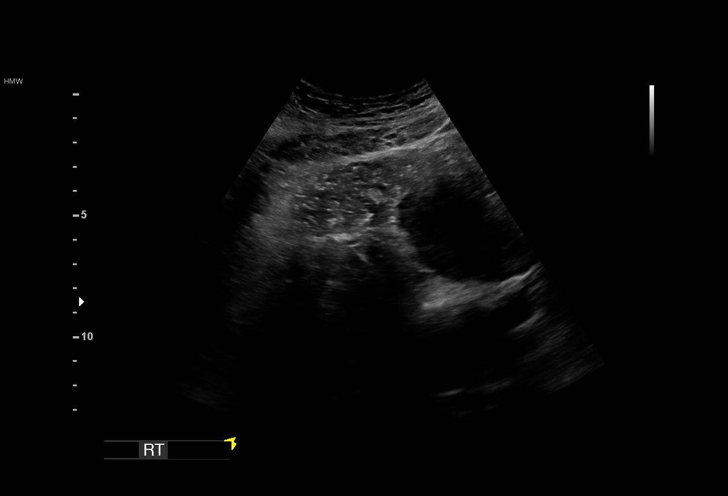
[im 30/48]
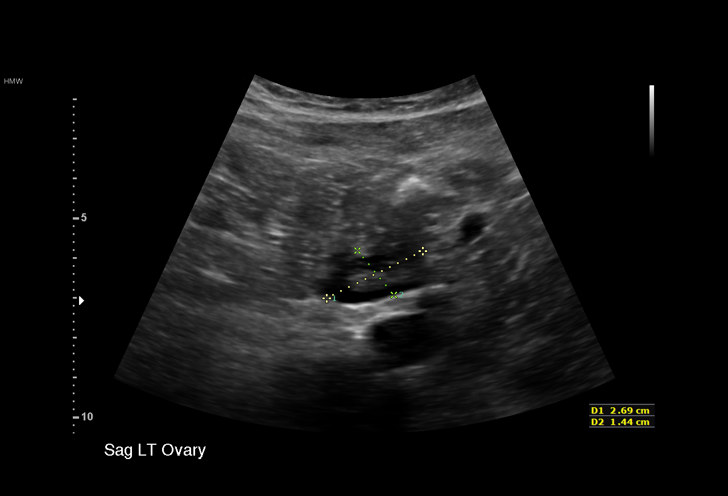
[im 34/48]
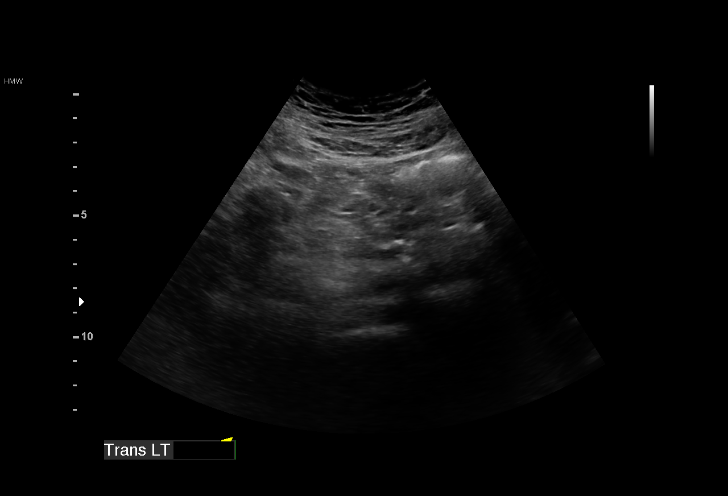
[im 38/48]
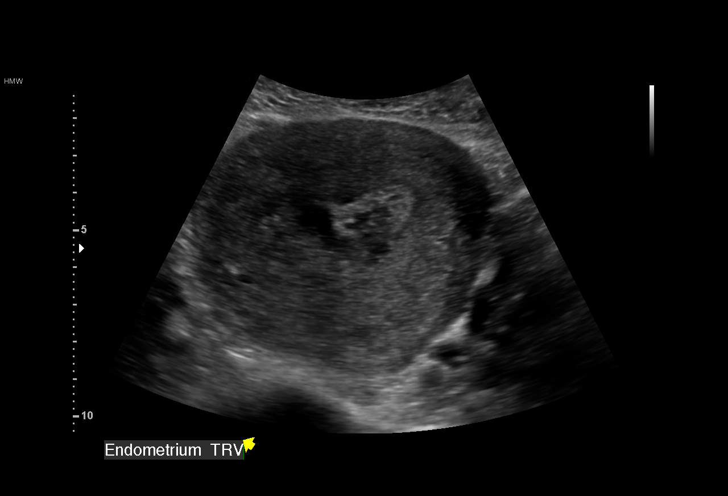
[im 40/48]
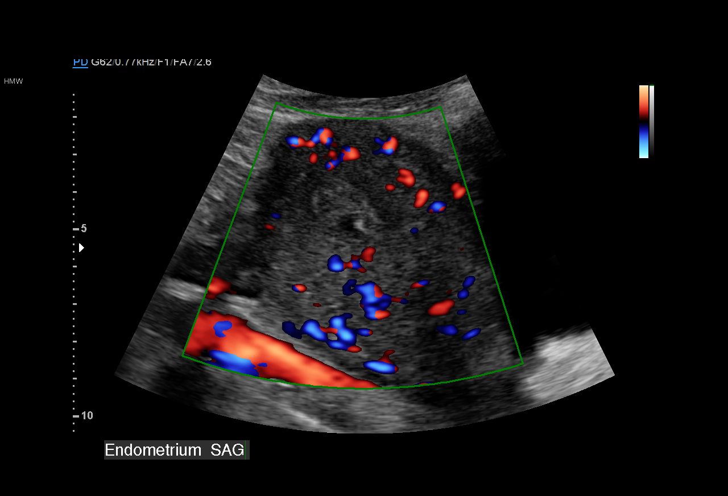
[im 44/48]
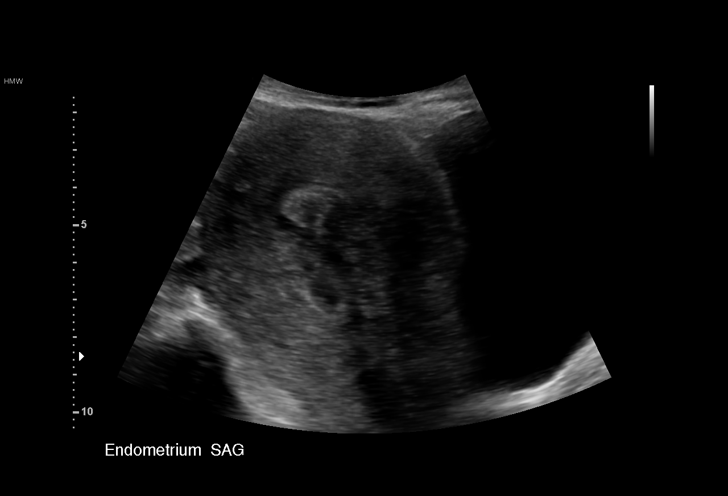
[im 48/48]
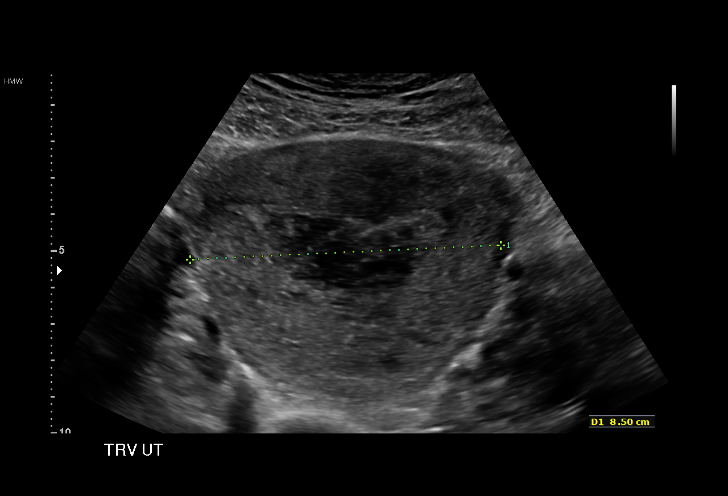

[15 of 25 positions shown; findings below may reference images not displayed]

FINDINGS: Uterus

Measurements: 11.7 x 7 x 8.5 cm. No fibroids or other mass
visualized.

Endometrium

Thickness: 27 mm. Heterogenous in echotexture with focal echogenic
area in the fundus measuring 1.5 x 1.1 x 2.1 cm. Slight increased
endometrial vascularity.

Right ovary

Measurements: 1.7 x 1.6 x 1.6 cm. Normal appearance/no adnexal mass.

Left ovary

Measurements: 2.7 x 1.4 x 2.6 cm. Normal appearance/no adnexal mass.

Other findings:  No abnormal free fluid.
IMPRESSION: Heterogenous thickened endometrium up to 27 mm with focal echogenic
mass at the fundus measuring 2.1 cm, findings could be consistent
with retained products of conception in the correct clinical
setting.

## 2018-04-01 ENCOUNTER — Other Ambulatory Visit: Payer: Self-pay

## 2018-04-03 ENCOUNTER — Other Ambulatory Visit: Payer: Self-pay

## 2018-04-03 ENCOUNTER — Encounter: Payer: Self-pay | Admitting: Obstetrics & Gynecology

## 2018-04-03 ENCOUNTER — Ambulatory Visit (INDEPENDENT_AMBULATORY_CARE_PROVIDER_SITE_OTHER): Payer: Commercial Managed Care - PPO | Admitting: Obstetrics & Gynecology

## 2018-04-03 VITALS — BP 120/72 | Ht 66.75 in | Wt 167.0 lb

## 2018-04-03 DIAGNOSIS — Z01419 Encounter for gynecological examination (general) (routine) without abnormal findings: Secondary | ICD-10-CM | POA: Diagnosis not present

## 2018-04-03 DIAGNOSIS — Z1151 Encounter for screening for human papillomavirus (HPV): Secondary | ICD-10-CM

## 2018-04-03 DIAGNOSIS — Z3009 Encounter for other general counseling and advice on contraception: Secondary | ICD-10-CM | POA: Diagnosis not present

## 2018-04-03 NOTE — Progress Notes (Signed)
  Julia Santos 01/26/1980 [id number]   History:    38 y.o. G4P1A3L1 Married.  Daughter is 2 yo.  RP:  Established patient presenting for annual gyn exam   HPI: Menstrual periods regular normal.  No BTB.  No pelvic pain.  No pain with IC.  Using rhythm method for contraception, declines alternative contraceptives.  Urine/BMs normal.  Breasts normal.  BMI 26.35.  Will f/u here for fasting Health labs.  Past medical history,surgical history, family history and social history were all reviewed and documented in the EPIC chart.  Gynecologic History Patient's last menstrual period was 03/21/2018. Contraception: rhythm method Last Pap: 04/2016. Results were: Negative/HPV HR neg Last mammogram: Never Bone Density: Never Colonoscopy: Never  Obstetric History OB History  Gravida Para Term Preterm AB Living  4 1 1   3 1  SAB TAB Ectopic Multiple Live Births  3     0 1    # Outcome Date GA Lbr Len/2nd Weight Sex Delivery Anes PTL Lv  4 Term 03/01/16 [redacted]w[redacted]d 11:56 / 00:37 6 lb 8.4 oz (2.96 kg) F Vag-Spont EPI  LIV  3 SAB           2 SAB           1 SAB              ROS: A ROS was performed and pertinent positives and negatives are included in the history.  GENERAL: No fevers or chills. HEENT: No change in vision, no earache, sore throat or sinus congestion. NECK: No pain or stiffness. CARDIOVASCULAR: No chest pain or pressure. No palpitations. PULMONARY: No shortness of breath, cough or wheeze. GASTROINTESTINAL: No abdominal pain, nausea, vomiting or diarrhea, melena or bright red blood per rectum. GENITOURINARY: No urinary frequency, urgency, hesitancy or dysuria. MUSCULOSKELETAL: No joint or muscle pain, no back pain, no recent trauma. DERMATOLOGIC: No rash, no itching, no lesions. ENDOCRINE: No polyuria, polydipsia, no heat or cold intolerance. No recent change in weight. HEMATOLOGICAL: No anemia or easy bruising or bleeding. NEUROLOGIC: No headache, seizures, numbness, tingling or weakness.  PSYCHIATRIC: No depression, no loss of interest in normal activity or change in sleep pattern.     Exam:   BP 120/72   Ht 5' 6.75" (1.695 m)   Wt 167 lb (75.8 kg)   LMP 03/21/2018   BMI 26.35 kg/m   Body mass index is 26.35 kg/m.  General appearance : Well developed well nourished female. No acute distress HEENT: Eyes: no retinal hemorrhage or exudates,  Neck supple, trachea midline, no carotid bruits, no thyroidmegaly Lungs: Clear to auscultation, no rhonchi or wheezes, or rib retractions  Heart: Regular rate and rhythm, no murmurs or gallops Breast:Examined in sitting and supine position were symmetrical in appearance, no palpable masses or tenderness,  no skin retraction, no nipple inversion, no nipple discharge, no skin discoloration, no axillary or supraclavicular lymphadenopathy Abdomen: no palpable masses or tenderness, no rebound or guarding Extremities: no edema or skin discoloration or tenderness  Pelvic: Vulva: Normal             Vagina: No gross lesions or discharge  Cervix: No gross lesions or discharge.  Pap/HPV HR done.  Uterus  AV, normal size, shape and consistency, non-tender and mobile  Adnexa  Without masses or tenderness  Anus: Normal   Assessment/Plan:  38 y.o. female for annual exam   1. Encounter for routine gynecological examination with Papanicolaou smear of cervix Normal gynecologic exam.  Pap test with   high-risk HPV done.  Breast exam normal.  We will follow-up here for fasting health labs.  Body mass index 26.35.  Recommend a lower calorie/carb diet.  Aerobic physical activities 5 times a week and weightlifting every 2 days. - CBC; Future - Comp Met (CMET); Future - Lipid panel; Future - TSH; Future - VITAMIN D 25 Hydroxy (Vit-D Deficiency, Fractures); Future  2. Encounter for other general counseling or advice on contraception Using rhythm method for contraception.  Declines alternative contraception.  Understands that it is not a sure method,  but okay if conceives.  Princess Bruins MD, 11:07 AM 04/03/2018

## 2018-04-04 ENCOUNTER — Other Ambulatory Visit: Payer: Commercial Managed Care - PPO

## 2018-04-04 ENCOUNTER — Encounter: Payer: Self-pay | Admitting: Obstetrics & Gynecology

## 2018-04-04 DIAGNOSIS — Z01419 Encounter for gynecological examination (general) (routine) without abnormal findings: Secondary | ICD-10-CM

## 2018-04-04 LAB — PAP, TP IMAGING W/ HPV RNA, RFLX HPV TYPE 16,18/45: HPV DNA High Risk: NOT DETECTED

## 2018-04-04 NOTE — Patient Instructions (Addendum)
1. Encounter for routine gynecological examination with Papanicolaou smear of cervix Normal gynecologic exam.  Pap test with high-risk HPV done.  Breast exam normal.  We will follow-up here for fasting health labs.  Body mass index 26.35.  Recommend a lower calorie/carb diet.  Aerobic physical activities 5 times a week and weightlifting every 2 days. - CBC; Future - Comp Met (CMET); Future - Lipid panel; Future - TSH; Future - VITAMIN D 25 Hydroxy (Vit-D Deficiency, Fractures); Future  2. Encounter for other general counseling or advice on contraception Using rhythm method for contraception.  Declines alternative contraception.  Understands that it is not a sure method, but okay if conceives.  Julia Santos, it was a pleasure seeing you today!  I will inform you of your results as soon as they are available.

## 2018-04-05 LAB — CBC
HCT: 37.9 % (ref 35.0–45.0)
HEMOGLOBIN: 13 g/dL (ref 11.7–15.5)
MCH: 32.7 pg (ref 27.0–33.0)
MCHC: 34.3 g/dL (ref 32.0–36.0)
MCV: 95.2 fL (ref 80.0–100.0)
MPV: 9.2 fL (ref 7.5–12.5)
Platelets: 448 10*3/uL — ABNORMAL HIGH (ref 140–400)
RBC: 3.98 10*6/uL (ref 3.80–5.10)
RDW: 11.3 % (ref 11.0–15.0)
WBC: 5.4 10*3/uL (ref 3.8–10.8)

## 2018-04-05 LAB — COMPREHENSIVE METABOLIC PANEL
AG Ratio: 1.5 (calc) (ref 1.0–2.5)
ALBUMIN MSPROF: 4 g/dL (ref 3.6–5.1)
ALT: 7 U/L (ref 6–29)
AST: 11 U/L (ref 10–30)
Alkaline phosphatase (APISO): 35 U/L (ref 31–125)
BUN: 14 mg/dL (ref 7–25)
CO2: 30 mmol/L (ref 20–32)
CREATININE: 0.83 mg/dL (ref 0.50–1.10)
Calcium: 9.1 mg/dL (ref 8.6–10.2)
Chloride: 102 mmol/L (ref 98–110)
GLOBULIN: 2.6 g/dL (ref 1.9–3.7)
GLUCOSE: 86 mg/dL (ref 65–99)
POTASSIUM: 4.4 mmol/L (ref 3.5–5.3)
SODIUM: 137 mmol/L (ref 135–146)
TOTAL PROTEIN: 6.6 g/dL (ref 6.1–8.1)
Total Bilirubin: 0.4 mg/dL (ref 0.2–1.2)

## 2018-04-05 LAB — LIPID PANEL
CHOL/HDL RATIO: 3.5 (calc) (ref <5.0)
Cholesterol: 180 mg/dL (ref <200)
HDL: 51 mg/dL (ref >=50)
LDL Cholesterol (Calc): 115 mg/dL (calc) — ABNORMAL HIGH
NON-HDL CHOLESTEROL (CALC): 129 mg/dL (ref <130)
Triglycerides: 58 mg/dL (ref <150)

## 2018-04-05 LAB — VITAMIN D 25 HYDROXY (VIT D DEFICIENCY, FRACTURES): Vit D, 25-Hydroxy: 28 ng/mL — ABNORMAL LOW (ref 30–100)

## 2018-04-05 LAB — TSH: TSH: 2.42 mIU/L

## 2018-04-07 ENCOUNTER — Other Ambulatory Visit: Payer: Self-pay | Admitting: *Deleted

## 2018-04-07 ENCOUNTER — Encounter: Payer: Self-pay | Admitting: *Deleted

## 2018-04-07 DIAGNOSIS — R7989 Other specified abnormal findings of blood chemistry: Secondary | ICD-10-CM

## 2018-07-30 ENCOUNTER — Other Ambulatory Visit: Payer: Commercial Managed Care - PPO

## 2018-07-31 ENCOUNTER — Other Ambulatory Visit: Payer: Self-pay

## 2018-07-31 ENCOUNTER — Other Ambulatory Visit: Payer: Commercial Managed Care - PPO

## 2018-07-31 DIAGNOSIS — R7989 Other specified abnormal findings of blood chemistry: Secondary | ICD-10-CM

## 2018-07-31 LAB — CBC
HCT: 38.4 % (ref 35.0–45.0)
Hemoglobin: 13.1 g/dL (ref 11.7–15.5)
MCH: 32.7 pg (ref 27.0–33.0)
MCHC: 34.1 g/dL (ref 32.0–36.0)
MCV: 95.8 fL (ref 80.0–100.0)
MPV: 9.2 fL (ref 7.5–12.5)
Platelets: 342 10*3/uL (ref 140–400)
RBC: 4.01 10*6/uL (ref 3.80–5.10)
RDW: 11.7 % (ref 11.0–15.0)
WBC: 5 10*3/uL (ref 3.8–10.8)

## 2019-03-29 ENCOUNTER — Emergency Department (HOSPITAL_COMMUNITY)
Admission: EM | Admit: 2019-03-29 | Discharge: 2019-03-29 | Disposition: A | Discharge status: Home / Self Care | Payer: Managed Care, Other (non HMO) | Attending: Emergency Medicine | Admitting: Emergency Medicine

## 2019-03-29 ENCOUNTER — Other Ambulatory Visit: Payer: Self-pay

## 2019-03-29 ENCOUNTER — Encounter (HOSPITAL_COMMUNITY): Payer: Self-pay

## 2019-03-29 DIAGNOSIS — D6861 Antiphospholipid syndrome: Secondary | ICD-10-CM | POA: Insufficient documentation

## 2019-03-29 DIAGNOSIS — Z7982 Long term (current) use of aspirin: Secondary | ICD-10-CM | POA: Insufficient documentation

## 2019-03-29 DIAGNOSIS — M79604 Pain in right leg: Secondary | ICD-10-CM | POA: Diagnosis not present

## 2019-03-29 MED ORDER — ENOXAPARIN SODIUM 120 MG/0.8ML ~~LOC~~ SOLN
1.5000 mg/kg | Freq: Once | SUBCUTANEOUS | Status: AC
  Administered 2019-03-29: 105 mg via SUBCUTANEOUS
  Filled 2019-03-29: qty 0.7

## 2019-03-29 NOTE — ED Triage Notes (Signed)
Pt states that this afternoon she noticed bruising, painful, hardened area to the back of her right calf. Pt states she does not recall an injury to the area. Pt states she takes ASA 81mg  daily and has an extensive family hx of DVT and PE.

## 2019-03-29 NOTE — Discharge Instructions (Addendum)
You were given a shot of blood thinner here in the emergency department.  IMPORTANT PATIENT INSTRUCTIONS:  You have been scheduled for an Outpatient Vascular Study at [facility].    If tomorrow is a Saturday, Sunday or holiday, please go to the [facility] Emergency Department Registration Desk at 11 am tomorrow morning and tell them you are there for a vascular study.   If tomorrow is a weekday (Monday-Friday), please go to [facility] at 11 am and tell them you are there for a vascular study.

## 2019-03-29 NOTE — ED Provider Notes (Signed)
[facility] DEPT Provider Note   CSN: [id number] Arrival date & time: 03/29/19  1922    History Chief Complaint  Patient presents with  . Leg Pain    Julia Santos is a 39 y.o. female with past medical history significant for anticardiolipin antibody who presents for evaluation of bruise.  Patient states yesterday she noticed a hardened, bruised area to the posterior aspect of her right calf.  She does not recall any injury.  She denies any fever, chills, nausea, vomiting, chest pain, shortness of breath, unilateral leg swelling, redness, warmth.  She does not have a personal history of DVT or PE however has a family history.  She does take a baby aspirin daily.  Area tender to palpation.  She denies any overlying erythema or warmth.  Denies additional aggravating relieving factors.  No numbness or tingling, bony tenderness, pain with ambulation  History obtained from patient and past medical record.  No interpreter is used.  HPI     Past Medical History:  Diagnosis Date  . Anticardiolipin antibody positive   . 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 Vaginal Pap smear, abnormal     Patient Active Problem List   Diagnosis Date Noted  . Maternal anemia, with delivery 03/02/2016  . SVD (spontaneous vaginal delivery) 03/01/2016  . Postpartum care following vaginal delivery (2/22) 03/01/2016    Past Surgical History:  Procedure Laterality Date  . DILATION AND EVACUATION N/A 03/16/2016   Procedure: Ultrasound guided DILATATION AND EVACUATION;  Surgeon: Servando Salina, MD;  Location: [facility];  Service: Gynecology;  Laterality: N/A;  . NO PAST SURGERIES       OB History    Gravida  4   Para  1   Term  1   Preterm      AB  3   Living  1     SAB  3   TAB      Ectopic      Multiple  0   Live Births  1           Family History  Problem Relation Age of Onset  . Hypertension Father   . Esophageal cancer Maternal Uncle   .  COPD Paternal 74   . COPD Maternal Grandfather   . Diabetes Maternal Grandfather     Social History   Tobacco Use  . Smoking status: Never Smoker  . Smokeless tobacco: Never Used  Substance Use Topics  . Alcohol use: No  . Drug use: No    Home Medications Prior to Admission medications   Medication Sig Start Date End Date Taking? Authorizing Provider  aspirin 81 MG chewable tablet Chew 81 mg by mouth daily.    [provider]    Allergies    Patient has no known allergies.  Review of Systems   Review of Systems  Constitutional: Negative.   HENT: Negative.   Respiratory: Negative.   Cardiovascular: Negative.   Gastrointestinal: Negative.   Genitourinary: Negative.   Musculoskeletal: Negative.   Skin: Positive for color change.  Neurological: Negative.   All other systems reviewed and are negative.   Physical Exam Updated Vital Signs BP (!) 153/100 (BP Location: Left Arm)   Pulse 78   Temp 98.1 F (36.7 C) (Oral)   Resp 16   Ht 5\' 7"  (1.702 m)   Wt 71.2 kg   SpO2 99%   BMI 24.59 kg/m   Physical Exam Vitals and nursing note reviewed.  Constitutional:      General: She is not in acute distress.    Appearance: She is well-developed. She is not ill-appearing, toxic-appearing or diaphoretic.  HENT:     Head: Normocephalic and atraumatic.     Nose: Nose normal.     Mouth/Throat:     Mouth: Mucous membranes are moist.     Pharynx: Oropharynx is clear.  Eyes:     Pupils: Pupils are equal, round, and reactive to light.  Cardiovascular:     Rate and Rhythm: Normal rate.     Pulses: Normal pulses.          Dorsalis pedis pulses are 2+ on the right side and 2+ on the left side.       Posterior tibial pulses are 2+ on the right side and 2+ on the left side.     Heart sounds: Normal heart sounds.  Pulmonary:     Effort: Pulmonary effort is normal. No respiratory distress.     Breath sounds: Normal breath sounds.  Abdominal:     General: Bowel  sounds are normal. There is no distension.  Musculoskeletal:        General: Normal range of motion.     Cervical back: Normal range of motion.     Right knee: Normal.     Left knee: Normal.     Right lower leg: Tenderness present. No swelling, deformity, lacerations or bony tenderness. No edema.     Left lower leg: Normal.     Right ankle: Normal.     Right Achilles Tendon: Normal.     Left ankle: Normal.     Left Achilles Tendon: Normal.       Legs:     Comments: Full range of motion without difficulty.  Compartments soft.  Bevelyn Buckles' sign negative.  Thompson test negative.  No edema, erythema or warmth.  No bony tenderness.  Feet:     Right foot:     Skin integrity: Skin integrity normal.     Left foot:     Skin integrity: Skin integrity normal.  Skin:    General: Skin is warm and dry.     Capillary Refill: Capillary refill takes less than 2 seconds.     Comments: Patient with 2 cm area of ecchymosis with induration to right posterior distal calf.  Compartments soft.  No fluctuance.  No erythema, warmth or edema.  Neurological:     Mental Status: She is alert.     Sensory: Sensation is intact.     Gait: Gait is intact.     Comments: Ambulatory without difficulty    ED Results / Procedures / Treatments   Labs (all labs ordered are listed, but only abnormal results are displayed) Labs Reviewed - No data to display  EKG None  Radiology No results found.  Procedures Procedures (including critical care time)  Medications Ordered in ED Medications  enoxaparin (LOVENOX) injection 105 mg (has no administration in time range)    ED Course  I have reviewed the triage vital signs and the nursing notes.  Pertinent labs & imaging results that were available during my care of the patient were reviewed by me and considered in my medical decision making (see chart for details).  39 year old female appears otherwise well presents for evaluation of bruise to right posterior  distal calf approximately 2 cm which began yesterday.  Unsure of injury or trauma.  No bony tenderness.  Normal musculoskeletal exam.  Neurovascularly intact.  She has  no circumferential edema, erythema or warmth. There is no fluctuance.  Compartments soft.  Bevelyn Buckles' sign negative.  She denies any chest pain, shortness of breath.  She has no tachycardia, tachypnea or hypoxia.  Low suspicion for PE.  No personal history of PE or DVT however does have family history.   I discussed with patient we do not have ultrasound available at this time.  Had a shared decision-making conversation for anticoagulation- Lovenox.  Will give 1 dose of Lovenox here in the emergency department and I have set up outpatient ultrasound.  Patient is very upset and concerned that this is a blood clot.  I discussed with patient this is a standard process and she is being treated as if this was a blood clot.  She does not appear on clinical exam to have large vessel vascular occlusion.  She has good perfusion. She continues denies chest pain, shortness of breath or hemoptysis.  Discussed with patient she has stable vital signs and appears overall well.  Area does seem consistent with bruise however given her family history I do think it is reasonable to obtain an ultrasound to ensure this is not a blood clot.  She has no evidence of infectious process, large vessel vascular occlusion, acute bony abnormality, myositis, compartment syndrome at this time.  Low suspicion for acute emergency at this time.  I discussed return precautions with patient.  She voiced understanding and is agreeable follow-up.  The patient has been appropriately medically screened and/or stabilized in the ED. I have low suspicion for any other emergent medical condition which would require further screening, evaluation or treatment in the ED or require inpatient management.  Patient is hemodynamically stable and in no acute distress.  Patient able to ambulate in  department prior to ED.  Evaluation does not show acute pathology that would require ongoing or additional emergent interventions while in the emergency department or further inpatient treatment.  I have discussed the diagnosis with the patient and answered all questions.  Pain is been managed while in the emergency department and patient has no further complaints prior to discharge.  Patient is comfortable with plan discussed in room and is stable for discharge at this time.  I have discussed strict return precautions for returning to the emergency department.  Patient was encouraged to follow-up with PCP/specialist refer to at discharge.    MDM Rules/Calculators/A&P                       Final Clinical Impression(s) / ED Diagnoses Final diagnoses:  Right leg pain    Rx / DC Orders ED Discharge Orders         Ordered    LE VENOUS  Status:  Canceled     03/29/19 2009    LE VENOUS     03/29/19 2011           Chandlar Staebell A, PA-C 03/29/19 2020    Daleen Bo, MD 03/30/19 2324

## 2019-03-30 ENCOUNTER — Ambulatory Visit (HOSPITAL_COMMUNITY)
Admission: RE | Admit: 2019-03-30 | Discharge: 2019-03-30 | Disposition: A | Discharge status: Home / Self Care | Payer: Managed Care, Other (non HMO) | Source: Ambulatory Visit | Attending: Physician Assistant | Admitting: Physician Assistant

## 2019-03-30 ENCOUNTER — Other Ambulatory Visit: Payer: Self-pay

## 2019-03-30 DIAGNOSIS — R52 Pain, unspecified: Secondary | ICD-10-CM | POA: Diagnosis not present

## 2019-03-30 DIAGNOSIS — M79669 Pain in unspecified lower leg: Secondary | ICD-10-CM | POA: Insufficient documentation

## 2019-03-30 NOTE — Progress Notes (Signed)
VASCULAR LAB PRELIMINARY  PRELIMINARY  PRELIMINARY  PRELIMINARY  Right lower extremity venous duplex completed.    Preliminary report:  See CV proc for preliminary results.  Kiarra Kidd, RVT 03/30/2019, 12:05 PM

## 2019-05-05 ENCOUNTER — Other Ambulatory Visit: Payer: Self-pay

## 2019-05-06 ENCOUNTER — Encounter: Payer: Self-pay | Admitting: Obstetrics & Gynecology

## 2019-07-16 ENCOUNTER — Other Ambulatory Visit: Payer: Self-pay

## 2019-07-16 ENCOUNTER — Ambulatory Visit (INDEPENDENT_AMBULATORY_CARE_PROVIDER_SITE_OTHER): Payer: Managed Care, Other (non HMO) | Admitting: Obstetrics & Gynecology

## 2019-07-16 ENCOUNTER — Encounter: Payer: Self-pay | Admitting: Obstetrics & Gynecology

## 2019-07-16 VITALS — BP 114/72 | Ht 66.5 in | Wt 151.4 lb

## 2019-07-16 DIAGNOSIS — Z3009 Encounter for other general counseling and advice on contraception: Secondary | ICD-10-CM | POA: Diagnosis not present

## 2019-07-16 DIAGNOSIS — Z01419 Encounter for gynecological examination (general) (routine) without abnormal findings: Secondary | ICD-10-CM

## 2019-07-16 NOTE — Progress Notes (Signed)
Julia Santos [birth year] [id number]   History:    39 y.o. G4P1A3L1 Married.  Daughter is 7 yo.  RP:  Established patient presenting for annual gyn exam   HPI: Menstrual periods regular normal every 3 to 4 weeks.  Last menstrual period, the flow was lighter than usual.  No BTB.  No pelvic pain.  No pain with IC.  Using rhythm method for contraception, declines alternative contraceptives.  Urine/BMs normal.  Breasts normal.  BMI 24.07. Fasting Health labs with Fam MD.   Past medical history,surgical history, family history and social history were all reviewed and documented in the EPIC chart.  Gynecologic History Patient's last menstrual period was 06/25/2019 (approximate).  Obstetric History OB History  Gravida Para Term Preterm AB Living  4 1 1   3 1   SAB TAB Ectopic Multiple Live Births  3     0 1    # Outcome Date GA Lbr Len/2nd Weight Sex Delivery Anes PTL Lv  4 Term 03/01/16 [redacted]w[redacted]d 11:56 / 00:37 6 lb 8.4 oz (2.96 kg) F Vag-Spont EPI  LIV  3 SAB           2 SAB           1 SAB              ROS: A ROS was performed and pertinent positives and negatives are included in the history.  GENERAL: No fevers or chills. HEENT: No change in vision, no earache, sore throat or sinus congestion. NECK: No pain or stiffness. CARDIOVASCULAR: No chest pain or pressure. No palpitations. PULMONARY: No shortness of breath, cough or wheeze. GASTROINTESTINAL: No abdominal pain, nausea, vomiting or diarrhea, melena or bright red blood per rectum. GENITOURINARY: No urinary frequency, urgency, hesitancy or dysuria. MUSCULOSKELETAL: No joint or muscle pain, no back pain, no recent trauma. DERMATOLOGIC: No rash, no itching, no lesions. ENDOCRINE: No polyuria, polydipsia, no heat or cold intolerance. No recent change in weight. HEMATOLOGICAL: No anemia or easy bruising or bleeding. NEUROLOGIC: No headache, seizures, numbness, tingling or weakness. PSYCHIATRIC: No depression, no loss of interest in normal  activity or change in sleep pattern.     Exam:   BP 114/72   Ht 5' 6.5" (1.689 m)   Wt 151 lb 6.4 oz (68.7 kg)   LMP 06/25/2019 (Approximate)   BMI 24.07 kg/m   Body mass index is 24.07 kg/m.  General appearance : Well developed well nourished female. No acute distress HEENT: Eyes: no retinal hemorrhage or exudates,  Neck supple, trachea midline, no carotid bruits, no thyroidmegaly Lungs: Clear to auscultation, no rhonchi or wheezes, or rib retractions  Heart: Regular rate and rhythm, no murmurs or gallops Breast:Examined in sitting and supine position were symmetrical in appearance, no palpable masses or tenderness,  no skin retraction, no nipple inversion, no nipple discharge, no skin discoloration, no axillary or supraclavicular lymphadenopathy Abdomen: no palpable masses or tenderness, no rebound or guarding Extremities: no edema or skin discoloration or tenderness  Pelvic: Vulva: Normal             Vagina: No gross lesions or discharge  Cervix: No gross lesions or discharge.  Pap reflex done.  Uterus  AV, normal size, shape and consistency, non-tender and mobile  Adnexa  Without masses or tenderness  Anus: Normal   Assessment/Plan:  39 y.o. female for annual exam   1. Encounter for routine gynecological examination with Papanicolaou smear of cervix Normal gynecologic exam. Pap reflex done. Breast exam  normal. Good body mass index at 24.07. Continue with fitness and healthy nutrition.  2. Encounter for other general counseling or advice on contraception Declines contraception other than the natural/rhythm method. Patient cautioned on the risks especially if her cycles are irregular.  Princess Bruins MD, 12:26 PM 07/16/2019

## 2019-07-20 ENCOUNTER — Encounter: Payer: Self-pay | Admitting: Obstetrics & Gynecology

## 2019-07-20 LAB — PAP IG W/ RFLX HPV ASCU

## 2020-06-07 ENCOUNTER — Ambulatory Visit
Admission: RE | Admit: 2020-06-07 | Discharge: 2020-06-07 | Disposition: A | Discharge status: Home / Self Care | Payer: Managed Care, Other (non HMO) | Source: Ambulatory Visit | Attending: Family Medicine | Admitting: Family Medicine

## 2020-06-07 ENCOUNTER — Other Ambulatory Visit: Payer: Self-pay | Admitting: Family Medicine

## 2020-06-07 DIAGNOSIS — R0602 Shortness of breath: Secondary | ICD-10-CM

## 2020-07-20 ENCOUNTER — Ambulatory Visit: Payer: Managed Care, Other (non HMO) | Admitting: Obstetrics & Gynecology

## 2020-09-13 ENCOUNTER — Other Ambulatory Visit (HOSPITAL_COMMUNITY)
Admission: RE | Admit: 2020-09-13 | Discharge: 2020-09-13 | Disposition: A | Discharge status: Home / Self Care | Payer: Managed Care, Other (non HMO) | Source: Ambulatory Visit | Attending: Obstetrics & Gynecology | Admitting: Obstetrics & Gynecology

## 2020-09-13 ENCOUNTER — Ambulatory Visit (INDEPENDENT_AMBULATORY_CARE_PROVIDER_SITE_OTHER): Payer: Managed Care, Other (non HMO) | Admitting: Obstetrics & Gynecology

## 2020-09-13 ENCOUNTER — Other Ambulatory Visit: Payer: Self-pay

## 2020-09-13 ENCOUNTER — Encounter: Payer: Self-pay | Admitting: Obstetrics & Gynecology

## 2020-09-13 VITALS — BP 116/74 | HR 71 | Resp 16 | Ht 66.25 in | Wt 151.0 lb

## 2020-09-13 DIAGNOSIS — Z3009 Encounter for other general counseling and advice on contraception: Secondary | ICD-10-CM

## 2020-09-13 DIAGNOSIS — Z01419 Encounter for gynecological examination (general) (routine) without abnormal findings: Secondary | ICD-10-CM | POA: Diagnosis not present

## 2020-09-13 NOTE — Progress Notes (Signed)
Julia Santos [birth year] [id number]   History:    40 y.o.  [id number] Married.  Daughter is 61 yo.   RP:  Established patient presenting for annual gyn exam    HPI: Menstrual periods regular normal every 3 to 4 weeks.  Last menstrual period, the flow was lighter than usual.  No BTB.  No pelvic pain.  H/P HPV HR Pos before 2018, since then HPV HR Neg.  No h/o cervical treatment.  No pain with IC.  Using rhythm method for contraception, declines alternative contraceptives.  Urine/BMs normal.  Breasts normal.  BMI 24.19. Fasting Health labs with Fam MD.    Past medical history,surgical history, family history and social history were all reviewed and documented in the EPIC chart.  Gynecologic History Patient's last menstrual period was 08/24/2020 (exact date).  Obstetric History OB History  Gravida Para Term Preterm AB Living  '4 1 1   3 1  '$ SAB IAB Ectopic Multiple Live Births  3     0 1    # Outcome Date GA Lbr Len/2nd Weight Sex Delivery Anes PTL Lv  4 Term 03/01/16 [id number] / 00:37 6 lb 8.4 oz (2.96 kg) F Vag-Spont EPI  LIV  3 SAB           2 SAB           1 SAB              ROS: A ROS was performed and pertinent positives and negatives are included in the history.  GENERAL: No fevers or chills. HEENT: No change in vision, no earache, sore throat or sinus congestion. NECK: No pain or stiffness. CARDIOVASCULAR: No chest pain or pressure. No palpitations. PULMONARY: No shortness of breath, cough or wheeze. GASTROINTESTINAL: No abdominal pain, nausea, vomiting or diarrhea, melena or bright red blood per rectum. GENITOURINARY: No urinary frequency, urgency, hesitancy or dysuria. MUSCULOSKELETAL: No joint or muscle pain, no back pain, no recent trauma. DERMATOLOGIC: No rash, no itching, no lesions. ENDOCRINE: No polyuria, polydipsia, no heat or cold intolerance. No recent change in weight. HEMATOLOGICAL: No anemia or easy bruising or bleeding. NEUROLOGIC: No headache, seizures, numbness,  tingling or weakness. PSYCHIATRIC: No depression, no loss of interest in normal activity or change in sleep pattern.     Exam:   BP 116/74   Pulse 71   Resp 16   Ht 5' 6.25" (1.683 m)   Wt 151 lb (68.5 kg)   LMP 08/24/2020 (Exact Date)   BMI 24.19 kg/m   Body mass index is 24.19 kg/m.  General appearance : Well developed well nourished female. No acute distress HEENT: Eyes: no retinal hemorrhage or exudates,  Neck supple, trachea midline, no carotid bruits, no thyroidmegaly Lungs: Clear to auscultation, no rhonchi or wheezes, or rib retractions  Heart: Regular rate and rhythm, no murmurs or gallops Breast:Examined in sitting and supine position were symmetrical in appearance, no palpable masses or tenderness,  no skin retraction, no nipple inversion, no nipple discharge, no skin discoloration, no axillary or supraclavicular lymphadenopathy Abdomen: no palpable masses or tenderness, no rebound or guarding Extremities: no edema or skin discoloration or tenderness  Pelvic: Vulva: Normal             Vagina: No gross lesions or discharge  Cervix: No gross lesions or discharge.  Pap/HPV HR done.  Uterus  AV, normal size, shape and consistency, non-tender and mobile  Adnexa  Without masses or tenderness  Anus: Normal  Assessment/Plan:  40 y.o. female for annual exam   1. Encounter for routine gynecological examination with Papanicolaou smear of cervix Normal gynecologic exam.  Pap/HPV HR done.  H/O HPV HR Pos before 2018.  Breasts normal.  Scheduling first screening mammo.  Good BMI at 24.19.  Good fitness and nutrition.  Health labs with Fam MD. - Cytology - PAP( [place])  2. Encounter for other general counseling or advice on contraception Using natural method of contraception, declines alternatives.  Other orders - ASPIRIN 81 PO; Take by mouth.   Princess Bruins MD, 1:43 PM 09/13/2020

## 2020-09-19 LAB — CYTOLOGY - PAP
Comment: NEGATIVE
Diagnosis: NEGATIVE
High risk HPV: NEGATIVE

## 2020-09-28 ENCOUNTER — Other Ambulatory Visit: Payer: Self-pay | Admitting: Obstetrics & Gynecology

## 2020-09-28 DIAGNOSIS — Z1231 Encounter for screening mammogram for malignant neoplasm of breast: Secondary | ICD-10-CM

## 2020-10-01 ENCOUNTER — Ambulatory Visit
Admission: RE | Admit: 2020-10-01 | Discharge: 2020-10-01 | Disposition: A | Discharge status: Home / Self Care | Payer: Managed Care, Other (non HMO) | Source: Ambulatory Visit | Attending: Obstetrics & Gynecology | Admitting: Obstetrics & Gynecology

## 2020-10-01 ENCOUNTER — Other Ambulatory Visit: Payer: Self-pay

## 2020-10-01 DIAGNOSIS — Z1231 Encounter for screening mammogram for malignant neoplasm of breast: Secondary | ICD-10-CM

## 2021-06-04 ENCOUNTER — Emergency Department (HOSPITAL_COMMUNITY)
Admission: EM | Admit: 2021-06-04 | Discharge: 2021-06-04 | Disposition: A | Discharge status: Home / Self Care | Payer: Managed Care, Other (non HMO) | Attending: Emergency Medicine | Admitting: Emergency Medicine

## 2021-06-04 ENCOUNTER — Emergency Department (HOSPITAL_COMMUNITY): Payer: Managed Care, Other (non HMO)

## 2021-06-04 DIAGNOSIS — S0990XA Unspecified injury of head, initial encounter: Secondary | ICD-10-CM

## 2021-06-04 DIAGNOSIS — M542 Cervicalgia: Secondary | ICD-10-CM | POA: Diagnosis not present

## 2021-06-04 DIAGNOSIS — S060X0A Concussion without loss of consciousness, initial encounter: Secondary | ICD-10-CM | POA: Diagnosis not present

## 2021-06-04 DIAGNOSIS — Y92410 Unspecified street and highway as the place of occurrence of the external cause: Secondary | ICD-10-CM | POA: Diagnosis not present

## 2021-06-04 DIAGNOSIS — Z87820 Personal history of traumatic brain injury: Secondary | ICD-10-CM

## 2021-06-04 DIAGNOSIS — Z7982 Long term (current) use of aspirin: Secondary | ICD-10-CM | POA: Diagnosis not present

## 2021-06-04 HISTORY — DX: Personal history of traumatic brain injury: Z87.820

## 2021-06-04 LAB — POC URINE PREG, ED: Preg Test, Ur: NEGATIVE

## 2021-06-04 MED ORDER — DEXAMETHASONE SODIUM PHOSPHATE 10 MG/ML IJ SOLN
10.0000 mg | Freq: Once | INTRAMUSCULAR | Status: AC
  Administered 2021-06-04: 10 mg via INTRAVENOUS
  Filled 2021-06-04: qty 1

## 2021-06-04 MED ORDER — METOCLOPRAMIDE HCL 5 MG/ML IJ SOLN
10.0000 mg | Freq: Once | INTRAMUSCULAR | Status: AC
Start: 2021-06-04 — End: 2021-06-04
  Administered 2021-06-04: 10 mg via INTRAVENOUS
  Filled 2021-06-04: qty 2

## 2021-06-04 MED ORDER — LACTATED RINGERS IV BOLUS
1000.0000 mL | Freq: Once | INTRAVENOUS | Status: AC
  Administered 2021-06-04: 1000 mL via INTRAVENOUS

## 2021-06-04 MED ORDER — KETOROLAC TROMETHAMINE 15 MG/ML IJ SOLN
15.0000 mg | Freq: Once | INTRAMUSCULAR | Status: AC
  Administered 2021-06-04: 15 mg via INTRAVENOUS
  Filled 2021-06-04: qty 1

## 2021-06-04 MED ORDER — METHOCARBAMOL 500 MG PO TABS: 500.0000 mg | ORAL_TABLET | Freq: Two times a day (BID) | ORAL | 0 refills | Status: DC
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
## 2021-06-04 NOTE — Discharge Instructions (Signed)
You were seen in the emergency department today for evaluation of your headache.  Your imaging was unremarkable.  You likely have a concussion.  We were giving you a migraine cocktail medications with fluids.  I am glad that you are improving with these medications.  For concussion protocol, I would avoid any bright lights, screens, or loud noises.  Please allow your brain to rest in a dark room without any disturbances.  Please make sure you are drinking plenty of fluids, mainly water.  I have included information for [facility] to follow-up with the concussion clinic.  You will need to call to schedule an appointment.  Additionally, I am prescribing you a muscle relaxer to take as needed.  Please do not operate heavy machinery or drive on this medication as it can make you drowsy.  You can also take Tylenol or ibuprofen as needed for pain.  If you have any concern, new or worsening symptoms, please return the nearest emergency department for reevaluation.  Contact a doctor if: Your symptoms do not get better. You have new symptoms. You have another injury. Get help right away if: You have bad headaches or your headaches get worse. You feel weak or numb in any part of your body. You feel mixed up (confused). Your balance gets worse. You vomit often. You feel more sleepy than normal. You cannot speak well, or have slurred speech. You have a seizure. Others have trouble waking you up. You have changes in how you act. You have changes in how you see (vision). You pass out (lose consciousness). These symptoms may be an emergency. Do not wait to see if the symptoms will go away. Get medical help right away. Call your local emergency services (911 in the [place] Do not drive yourself to the hospital.
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
## 2021-06-07 ENCOUNTER — Ambulatory Visit (INDEPENDENT_AMBULATORY_CARE_PROVIDER_SITE_OTHER): Payer: Managed Care, Other (non HMO) | Admitting: Sports Medicine

## 2021-06-07 VITALS — BP 110/80 | HR 68 | Ht 66.0 in | Wt 148.0 lb

## 2021-06-07 DIAGNOSIS — S060X0A Concussion without loss of consciousness, initial encounter: Secondary | ICD-10-CM

## 2021-06-07 DIAGNOSIS — G4700 Insomnia, unspecified: Secondary | ICD-10-CM | POA: Diagnosis not present

## 2021-06-07 NOTE — Patient Instructions (Addendum)
Good to see you   Complete mental and physical rest for 48 hours after concussive event Recommend light aerobic activity while keeping symptoms less than 3/10 Stop mental or physical activities that cause symptoms to worsen greater than 3/10, and wait 24 hours before attempting them again Eliminate screen time as much as possible for first 48 hours after concussive event, then continue limited screen time (recommend less than 2 hours per day Work note not provided  Through 07/07/2021 Start melatonin '5mg'$  nightly 1 week follow up
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
## 2021-06-14 ENCOUNTER — Ambulatory Visit: Payer: Managed Care, Other (non HMO) | Admitting: Sports Medicine

## 2021-06-14 VITALS — BP 110/72 | HR 84 | Ht 66.0 in | Wt 148.0 lb

## 2021-06-14 DIAGNOSIS — G44319 Acute post-traumatic headache, not intractable: Secondary | ICD-10-CM | POA: Diagnosis not present

## 2021-06-14 DIAGNOSIS — S060X0A Concussion without loss of consciousness, initial encounter: Secondary | ICD-10-CM

## 2021-06-14 DIAGNOSIS — G4700 Insomnia, unspecified: Secondary | ICD-10-CM | POA: Diagnosis not present

## 2021-06-14 NOTE — Patient Instructions (Addendum)
Good to see you  Neck HEP  2 week follow up  
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
## 2021-06-30 ENCOUNTER — Ambulatory Visit: Payer: Managed Care, Other (non HMO) | Admitting: Sports Medicine

## 2021-06-30 VITALS — BP 110/82 | HR 57 | Ht 66.0 in | Wt 148.0 lb

## 2021-06-30 DIAGNOSIS — S060X0A Concussion without loss of consciousness, initial encounter: Secondary | ICD-10-CM | POA: Diagnosis not present

## 2021-09-15 ENCOUNTER — Encounter: Payer: Self-pay | Admitting: Obstetrics & Gynecology

## 2021-09-15 ENCOUNTER — Ambulatory Visit (INDEPENDENT_AMBULATORY_CARE_PROVIDER_SITE_OTHER): Payer: Managed Care, Other (non HMO) | Admitting: Obstetrics & Gynecology

## 2021-09-15 ENCOUNTER — Other Ambulatory Visit (HOSPITAL_COMMUNITY)
Admission: RE | Admit: 2021-09-15 | Discharge: 2021-09-15 | Disposition: A | Discharge status: Home / Self Care | Payer: Managed Care, Other (non HMO) | Source: Ambulatory Visit | Attending: Obstetrics & Gynecology | Admitting: Obstetrics & Gynecology

## 2021-09-15 VITALS — BP 112/70 | HR 70 | Resp 16 | Ht 66.5 in | Wt 147.0 lb

## 2021-09-15 DIAGNOSIS — Z01419 Encounter for gynecological examination (general) (routine) without abnormal findings: Secondary | ICD-10-CM

## 2021-09-15 DIAGNOSIS — Z3009 Encounter for other general counseling and advice on contraception: Secondary | ICD-10-CM

## 2021-09-15 NOTE — Progress Notes (Signed)
Julia Santos 1980/04/24 [id number]   History:    41 y.o.  [id number] Married.  Daughter is 10 yo, started kindergarten.   RP:  Established patient presenting for annual gyn exam    HPI: Menstrual periods regular normal every 3 to 4 weeks.  No BTB.  No pelvic pain.  H/P HPV HR Pos before 2018, since then HPV HR Neg.  No h/o cervical treatment. Pap Neg 09/2020.  Pap reflex today. No pain with IC.  Using rhythm method for contraception, declines alternative contraceptives.  Urine/BMs normal.  Breasts normal. Mammo Neg 09/2020, scheduled for this year. BMI 23.37. Walking and weights at [employer]. Fasting Health labs with Fam MD.  Past medical history,surgical history, family history and social history were all reviewed and documented in the EPIC chart.  Gynecologic History Patient's last menstrual period was 08/29/2021 (exact date).  Obstetric History OB History  Gravida Para Term Preterm AB Living  '4 1 1   3 1  '$ SAB IAB Ectopic Multiple Live Births  3     0 1    # Outcome Date GA Lbr Len/2nd Weight Sex Delivery Anes PTL Lv  4 Term 03/01/16 [id number]:56 / 00:37 6 lb 8.4 oz (2.96 kg) F Vag-Spont EPI  LIV  3 SAB           2 SAB           1 SAB              ROS: A ROS was performed and pertinent positives and negatives are included in the history. GENERAL: No fevers or chills. HEENT: No change in vision, no earache, sore throat or sinus congestion. NECK: No pain or stiffness. CARDIOVASCULAR: No chest pain or pressure. No palpitations. PULMONARY: No shortness of breath, cough or wheeze. GASTROINTESTINAL: No abdominal pain, nausea, vomiting or diarrhea, melena or bright red blood per rectum. GENITOURINARY: No urinary frequency, urgency, hesitancy or dysuria. MUSCULOSKELETAL: No joint or muscle pain, no back pain, no recent trauma. DERMATOLOGIC: No rash, no itching, no lesions. ENDOCRINE: No polyuria, polydipsia, no heat or cold intolerance. No recent change in weight. HEMATOLOGICAL: No anemia or easy  bruising or bleeding. NEUROLOGIC: No headache, seizures, numbness, tingling or weakness. PSYCHIATRIC: No depression, no loss of interest in normal activity or change in sleep pattern.     Exam:   BP 112/70   Pulse 70   Resp 16   Ht 5' 6.5" (1.689 m)   Wt 147 lb (66.7 kg)   LMP 08/29/2021 (Exact Date)   BMI 23.37 kg/m   Body mass index is 23.37 kg/m.  General appearance : Well developed well nourished female. No acute distress HEENT: Eyes: no retinal hemorrhage or exudates,  Neck supple, trachea midline, no carotid bruits, no thyroidmegaly Lungs: Clear to auscultation, no rhonchi or wheezes, or rib retractions  Heart: Regular rate and rhythm, no murmurs or gallops Breast:Examined in sitting and supine position were symmetrical in appearance, no palpable masses or tenderness,  no skin retraction, no nipple inversion, no nipple discharge, no skin discoloration, no axillary or supraclavicular lymphadenopathy Abdomen: no palpable masses or tenderness, no rebound or guarding Extremities: no edema or skin discoloration or tenderness  Pelvic: Vulva: Normal             Vagina: No gross lesions or discharge  Cervix: No gross lesions or discharge.  Pap reflex done.  Uterus  AV, normal size, shape and consistency, non-tender and mobile  Adnexa  Without masses or  tenderness  Anus: Normal   Assessment/Plan:  41 y.o. female for annual exam   1. Encounter for routine gynecological examination with Papanicolaou smear of cervix Menstrual periods regular normal every 3 to 4 weeks.  No BTB.  No pelvic pain.  H/P HPV HR Pos before 2018, since then HPV HR Neg.  No h/o cervical treatment. Pap Neg 09/2020.  Pap reflex today. No pain with IC.  Using rhythm method for contraception, declines alternative contraceptives.  Urine/BMs normal.  Breasts normal. Mammo Neg 09/2020, scheduled for this year. BMI 23.37. Walking and weights at [employer]. Fasting Health labs with Fam MD. - Cytology - PAP( [place])  2.  Encounter for other general counseling or advice on contraception  Using rhythm method for contraception, declines alternative contraceptives.   Princess Bruins MD, 9:03 AM 09/15/2021

## 2021-09-20 LAB — CYTOLOGY - PAP: Diagnosis: NEGATIVE

## 2021-09-22 ENCOUNTER — Other Ambulatory Visit: Payer: Self-pay | Admitting: Family Medicine

## 2021-09-22 DIAGNOSIS — Z1231 Encounter for screening mammogram for malignant neoplasm of breast: Secondary | ICD-10-CM

## 2021-10-03 ENCOUNTER — Ambulatory Visit
Admission: RE | Admit: 2021-10-03 | Discharge: 2021-10-03 | Disposition: A | Discharge status: Home / Self Care | Payer: Managed Care, Other (non HMO) | Source: Ambulatory Visit | Attending: Family Medicine | Admitting: Family Medicine

## 2021-10-03 DIAGNOSIS — Z1231 Encounter for screening mammogram for malignant neoplasm of breast: Secondary | ICD-10-CM

## 2021-10-04 ENCOUNTER — Other Ambulatory Visit: Payer: Self-pay | Admitting: Family Medicine

## 2021-10-04 DIAGNOSIS — R928 Other abnormal and inconclusive findings on diagnostic imaging of breast: Secondary | ICD-10-CM

## 2021-10-23 ENCOUNTER — Ambulatory Visit
Admission: RE | Admit: 2021-10-23 | Discharge: 2021-10-23 | Disposition: A | Discharge status: Home / Self Care | Payer: Managed Care, Other (non HMO) | Source: Ambulatory Visit | Attending: Family Medicine | Admitting: Family Medicine

## 2021-10-23 DIAGNOSIS — R928 Other abnormal and inconclusive findings on diagnostic imaging of breast: Secondary | ICD-10-CM

## 2022-08-09 DIAGNOSIS — R760 Raised antibody titer: Secondary | ICD-10-CM

## 2022-08-09 HISTORY — DX: Raised antibody titer: R76.0

## 2022-09-03 ENCOUNTER — Other Ambulatory Visit: Payer: Self-pay | Admitting: Family Medicine

## 2022-09-03 DIAGNOSIS — Z1231 Encounter for screening mammogram for malignant neoplasm of breast: Secondary | ICD-10-CM

## 2022-09-04 ENCOUNTER — Inpatient Hospital Stay: Payer: Managed Care, Other (non HMO) | Attending: Hematology and Oncology | Admitting: Hematology and Oncology

## 2022-09-04 ENCOUNTER — Encounter: Payer: Self-pay | Admitting: Hematology and Oncology

## 2022-09-04 ENCOUNTER — Inpatient Hospital Stay: Payer: Managed Care, Other (non HMO)

## 2022-09-04 VITALS — BP 110/62 | HR 76 | Temp 98.1°F | Resp 16 | Wt 144.6 lb

## 2022-09-04 DIAGNOSIS — Z87891 Personal history of nicotine dependence: Secondary | ICD-10-CM | POA: Insufficient documentation

## 2022-09-04 DIAGNOSIS — Z832 Family history of diseases of the blood and blood-forming organs and certain disorders involving the immune mechanism: Secondary | ICD-10-CM

## 2022-09-04 DIAGNOSIS — R760 Raised antibody titer: Secondary | ICD-10-CM

## 2022-09-04 DIAGNOSIS — Z7982 Long term (current) use of aspirin: Secondary | ICD-10-CM | POA: Diagnosis not present

## 2022-09-04 DIAGNOSIS — O039 Complete or unspecified spontaneous abortion without complication: Secondary | ICD-10-CM

## 2022-09-04 DIAGNOSIS — D6861 Antiphospholipid syndrome: Secondary | ICD-10-CM | POA: Insufficient documentation

## 2022-09-04 LAB — ANTITHROMBIN III: AntiThromb III Func: 115 % (ref 75–120)

## 2022-09-04 NOTE — Progress Notes (Signed)
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  She arrived to the appointment today by herself.  She tells me that there is significant family history of DVT/PE, maternal grandmother, maternal aunt, paternal aunt had history of DVT/PE. She personally has never had any history of DVT/PE.  We have discussed the etiology, pathogenesis of antiphospholipid antibody syndrome, clinical manifestations of APLS and laboratory criteria required for diagnosis.  I do not believe she has inherited the antiphospholipid antibody syndrome, typically this is an acquired disorder.  However given her significant family history of hypercoagulable workup and since she is unsure of having to take aspirin, I think it is reasonable to consider hypercoagulable workup for her as well in addition to repeating the anticardiolipin antibodies.  She is agreeable to this plan.  I will try to call her in a couple weeks to review the lab results and to discuss any additional recommendations. Thank you for consulting [place] in the care of this patient.  Please do not hesitate to contact us with any additional questions or concerns.  HISTORY OF PRESENTING ILLNESS:  Julia Santos 42 y.o. female is here because of possible anticardiolipin antibodies  This is a very pleasant 42 year old premenopausal female patient with 2 prior first trimester miscarriages and was thought to have positive anticardiolipin antibodies for which she is on indefinite aspirin who also happens to be significant family history of blood clots as  mentioned below referred to hematology for additional recommendations.  Maternal grandmother died of PE Maternal aunts had recurrent PE Paternal aunt had history of DVT/PE  She personally has never had a history of DVT/PE during antepartum or postpartum. She is otherwise healthy, is self-employed.  Rest of the pertinent 10 point ROS reviewed and negative  REVIEW OF SYSTEMS:   Constitutional: Denies fevers, chills or abnormal night sweats Eyes: Denies blurriness of vision, double vision or watery eyes Ears, nose, mouth, throat, and face: Denies mucositis or sore throat Respiratory: Denies cough, dyspnea or wheezes Cardiovascular: Denies palpitation, chest discomfort or lower extremity swelling Gastrointestinal:  Denies nausea, heartburn or change in bowel habits Skin: Denies abnormal skin rashes Lymphatics: Denies new lymphadenopathy or easy bruising Neurological:Denies numbness, tingling or new weaknesses Behavioral/Psych: Mood is stable, no new changes  All other systems were reviewed with the patient and are negative.  MEDICAL HISTORY:  Past Medical History:  Diagnosis Date   Anticardiolipin antibody positive    Heterozygous MTHFR mutation [id number]    Vaginal Pap smear, abnormal     SURGICAL HISTORY: Past Surgical History:  Procedure Laterality Date   DILATION AND EVACUATION N/A 03/16/2016   Procedure: Ultrasound guided DILATATION AND EVACUATION;  Surgeon: Maxie Better, MD;  Location: [facility];  Service: Gynecology;  Laterality: N/A;   NO PAST SURGERIES      SOCIAL HISTORY: Social History   Socioeconomic History   Marital status: Married    Spouse name: Not on file   Number of children: Not on file   Years of education: Not on file   Highest education level: Not on file  Occupational History   Not on file  Tobacco Use   Smoking status: Former  Types: Cigarettes   Smokeless tobacco: Never  Vaping Use   Vaping status: Never Used  Substance and Sexual Activity    Alcohol use: No   Drug use: No   Sexual activity: Not Currently    Partners: Male    Birth control/protection: Abstinence    Comment: 1st intercourse- 46,  Other Topics Concern   Not on file  Social History Narrative   Not on file   Social Determinants of Health   Financial Resource Strain: Not on file  Food Insecurity: Not on file  Transportation Needs: Not on file  Physical Activity: Not on file  Stress: Not on file  Social Connections: Not on file  Intimate Partner Violence: Not on file    FAMILY HISTORY: Family History  Problem Relation Age of Onset   Hypertension Father    Cancer Sister        unsure exactly what kind; but had hysterectomy   Esophageal cancer Maternal Uncle    Breast cancer Paternal Aunt 30 - 43   COPD Paternal Aunt    COPD Maternal Grandfather    Diabetes Maternal Grandfather     ALLERGIES:  has No Known Allergies.  MEDICATIONS:  Current Outpatient Medications  Medication Sig Dispense Refill   ASPIRIN 81 PO Take 81 mg by mouth daily.     No current facility-administered medications for this visit.     PHYSICAL EXAMINATION: ECOG PERFORMANCE STATUS: 0 - Asymptomatic  Vitals:   09/04/22 1029  BP: 110/62  Pulse: 76  Resp: 16  Temp: 98.1 F (36.7 C)  SpO2: 100%   Filed Weights   09/04/22 1029  Weight: 144 lb 9.6 oz (65.6 kg)    GENERAL:alert, no distress and comfortable SKIN: skin color, texture, turgor are normal, no rashes or significant lesions EYES: normal, conjunctiva are pink and non-injected, sclera clear OROPHARYNX:no exudate, no erythema and lips, buccal mucosa, and tongue normal  NECK: supple, thyroid normal size, non-tender, without nodularity LYMPH:  no palpable lymphadenopathy in the cervical, axillary LUNGS: clear to auscultation and percussion with normal breathing effort HEART: regular rate & rhythm and no murmurs and no lower extremity edema ABDOMEN:abdomen soft, non-tender and normal bowel  sounds Musculoskeletal:no cyanosis of digits and no clubbing  PSYCH: alert & oriented x 3 with fluent speech NEURO: no focal motor/sensory deficits  LABORATORY DATA:  I have reviewed the data as listed Lab Results  Component Value Date   WBC 5.0 07/31/2018   HGB 13.1 07/31/2018   HCT 38.4 07/31/2018   MCV 95.8 07/31/2018   PLT 342 07/31/2018     Chemistry      Component Value Date/Time   NA 137 04/04/2018 0955   K 4.4 04/04/2018 0955   CL 102 04/04/2018 0955   CO2 30 04/04/2018 0955   BUN 14 04/04/2018 0955   CREATININE 0.83 04/04/2018 0955      Component Value Date/Time   CALCIUM 9.1 04/04/2018 0955   AST 11 04/04/2018 0955   ALT 7 04/04/2018 0955   BILITOT 0.4 04/04/2018 0955       RADIOGRAPHIC STUDIES: I have personally reviewed the radiological images as listed and agreed with the findings in the report. No results found.  All questions were answered. The patient knows to call the clinic with any problems, questions or concerns. I spent 45 minutes in the care of this patient including H and P, review of records, counseling and coordination of care.     Rachel Moulds, MD 09/04/2022 10:36 AM

## 2022-09-05 LAB — CARDIOLIPIN ANTIBODIES, IGG, IGM, IGA
Anticardiolipin IgA: <9 U/mL (ref 0–11)
Anticardiolipin IgG: <9 GPL U/mL (ref 0–14)
Anticardiolipin IgM: 13 MPL U/mL — ABNORMAL HIGH (ref 0–12)

## 2022-09-05 LAB — LUPUS ANTICOAGULANT PANEL
DRVVT: 34 s (ref 0.0–47.0)
PTT Lupus Anticoagulant: 37 s (ref 0.0–43.5)

## 2022-09-05 LAB — PROTEIN S ACTIVITY: Protein S Activity: 91 % (ref 63–140)

## 2022-09-05 LAB — PROTEIN C ACTIVITY: Protein C Activity: 116 % (ref 73–180)

## 2022-09-05 LAB — BETA-2-GLYCOPROTEIN I ABS, IGG/M/A
Beta-2 Glyco I IgG: <9 GPI IgG units (ref 0–20)
Beta-2-Glycoprotein I IgA: <9 GPI IgA units (ref 0–25)
Beta-2-Glycoprotein I IgM: <9 GPI IgM units (ref 0–32)

## 2022-09-11 LAB — PROTHROMBIN GENE MUTATION

## 2022-09-11 LAB — HEXAGONAL PHOSPHOLIPID NEUTRALIZATION: Hexagonal Phospholipid Neutral: 7 s

## 2022-09-12 LAB — FACTOR 5 LEIDEN

## 2022-09-18 ENCOUNTER — Encounter: Payer: Self-pay | Admitting: Radiology

## 2022-09-18 ENCOUNTER — Ambulatory Visit (INDEPENDENT_AMBULATORY_CARE_PROVIDER_SITE_OTHER): Payer: Managed Care, Other (non HMO) | Admitting: Radiology

## 2022-09-18 ENCOUNTER — Other Ambulatory Visit (HOSPITAL_COMMUNITY)
Admission: RE | Admit: 2022-09-18 | Discharge: 2022-09-18 | Disposition: A | Discharge status: Home / Self Care | Payer: Managed Care, Other (non HMO) | Source: Ambulatory Visit | Attending: Radiology | Admitting: Radiology

## 2022-09-18 VITALS — BP 112/70 | HR 68 | Resp 12 | Ht 66.25 in | Wt 145.0 lb

## 2022-09-18 DIAGNOSIS — Z01419 Encounter for gynecological examination (general) (routine) without abnormal findings: Secondary | ICD-10-CM | POA: Insufficient documentation

## 2022-09-18 NOTE — Progress Notes (Signed)
Julia Santos 08-07-1980 [id number]   History:  42 y.o. G4P1 presents for annual exam. Hx of recurrent pregnancy loss and Antiphospholipid antibody syndrome, being worked up for other coags by Hematology. Reports periods have been uncharacteristically irregular and heavy the past 2 out of 3 cycles. Soaked through a super tampon in 2 months ago. Her last period was normal. Family hx of breast ca in a paternal aunt.  Gynecologic History Patient's last menstrual period was 09/07/2022. Period Duration (Days): 5 Period Pattern: (!) Irregular Menstrual Flow: Heavy Menstrual Control: Tampon Menstrual Control Change Freq (Hours): cycle in July saturated a super tampon in 45 minutes Dysmenorrhea: (!) Mild (breast tenderness) Dysmenorrhea Symptoms: Cramping Contraception/Family planning: rhythm method Sexually active: yes Last Pap: 2023. Results were: normal, 2022 normal, 2021 normal Last mammogram: 10/23. Results were: normal after u/s imaging right breast  Obstetric History OB History  Gravida Para Term Preterm AB Living  4 1 1   3 1   SAB IAB Ectopic Multiple Live Births  3     0 1    # Outcome Date GA Lbr Len/2nd Weight Sex Type Anes PTL Lv  4 Term 03/01/16 103w4d 11:56 / 00:37 6 lb 8.4 oz (2.96 kg) F Vag-Spont EPI  LIV  3 SAB           2 SAB           1 SAB              The following portions of the patient's history were reviewed and updated as appropriate: allergies, current medications, past family history, past medical history, past social history, past surgical history, and problem list.  Review of Systems Pertinent items noted in HPI and remainder of comprehensive ROS otherwise negative.   Past medical history, past surgical history, family history and social history were all reviewed and documented in the EPIC chart.   Exam:  Vitals:   09/18/22 0955  BP: 112/70  Pulse: 68  Resp: 12  Weight: 145 lb (65.8 kg)  Height: 5' 6.25" (1.683 m)   Body mass index is  23.23 kg/m.  General appearance:  Normal Thyroid:  Symmetrical, normal in size, without palpable masses or nodularity. Respiratory  Auscultation:  Clear without wheezing or rhonchi Cardiovascular  Auscultation:  Regular rate, without rubs, murmurs or gallops  Edema/varicosities:  Not grossly evident Abdominal  Soft,nontender, without masses, guarding or rebound.  Liver/spleen:  No organomegaly noted  Hernia:  None appreciated  Skin  Inspection:  Grossly normal Breasts: Examined lying and sitting.   Right: Without masses, retractions, nipple discharge or axillary adenopathy.   Left: Without masses, retractions, nipple discharge or axillary adenopathy. Genitourinary   Inguinal/mons:  Normal without inguinal adenopathy  External genitalia:  Normal appearing vulva with no masses, tenderness, or lesions  BUS/Urethra/Skene's glands:  Normal without masses or exudate  Vagina:  Normal appearing with normal color and discharge, no lesions  Cervix:  Normal appearing without discharge or lesions  Uterus:  Normal in size, shape and contour.  Mobile, nontender  Adnexa/parametria:     Rt: Normal in size, without masses or tenderness.   Lt: Normal in size, without masses or tenderness.  Anus and perineum: Normal   Raynelle Fanning, CMA present for exam  Assessment/Plan:   1. Well woman exam with routine gynecological exam Requests yearly paps, hx of HR HPV - Cytology - PAP( [place]) - Mammogram yearly, if she continues to be called back for u/s or additonal images may  benefit from breast MRI - Continue to monitor cycles, call for any irregular persistent heavy bleeding or skipping cycles. Would be safe with her APS to use progestin only to manage.   Discussed SBE, colonoscopy and pap screening as directed/appropriate. Recommend of exercise weekly, including weight bearing exercise. Encouraged the use of seatbelts and sunscreen. Return in 1 year for annual or as needed.    Arlie Solomons B WHNP-BC 10:17 AM 09/18/2022

## 2022-09-20 LAB — CYTOLOGY - PAP
Comment: NEGATIVE
Diagnosis: NEGATIVE
High risk HPV: NEGATIVE

## 2022-09-24 ENCOUNTER — Telehealth: Payer: Managed Care, Other (non HMO) | Admitting: Hematology and Oncology

## 2022-10-05 ENCOUNTER — Inpatient Hospital Stay: Payer: Managed Care, Other (non HMO) | Attending: Hematology and Oncology | Admitting: Hematology and Oncology

## 2022-10-05 DIAGNOSIS — R760 Raised antibody titer: Secondary | ICD-10-CM | POA: Diagnosis not present

## 2022-10-05 NOTE — Progress Notes (Signed)
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   We have reviewed her hypercoagulable workup which showed mildly elevated anticardiolipin IgM antibodies at 13 units.  We have discussed that the rest of the hypercoagulable workup was unremarkable.  At this time her anticardiolipin titers not concerning for antiphospholipid antibody syndrome. She can continue to take aspirin although there is no clear indication for this. At this time she does not need regular follow-up with hematology.  She can return to clinic as needed.  Thank you for consulting [place] in the care of this patient.  Please do not hesitate to contact us with any additional questions or concerns.  HISTORY OF PRESENTING ILLNESS:  Julia Santos 42 y.o. female is here because of possible anticardiolipin antibodies  This is a very pleasant 42 year old premenopausal female patient with 2 prior first trimester miscarriages and was thought to have positive anticardiolipin antibodies for which she is on indefinite aspirin who also happens to be significant family history of blood clots as mentioned below referred to hematology for additional recommendations.  Maternal grandmother died of PE Maternal aunts had recurrent PE Paternal aunt had history of DVT/PE  She personally has never had a history of DVT/PE during antepartum or postpartum. She is otherwise healthy, is self-employed.  This is a follow-up telephone visit to review her hypercoagulable workup.  Rest of the pertinent 10 point ROS reviewed and  negative  REVIEW OF SYSTEMS:   Constitutional: Denies fevers, chills or abnormal night sweats Eyes: Denies blurriness of vision, double vision or watery eyes Ears, nose, mouth, throat, and face: Denies mucositis or sore throat Respiratory: Denies cough, dyspnea or wheezes Cardiovascular: Denies palpitation, chest discomfort or lower extremity swelling Gastrointestinal:  Denies nausea, heartburn or change in bowel habits Skin: Denies abnormal skin rashes Lymphatics: Denies new lymphadenopathy or easy bruising Neurological:Denies numbness, tingling or new weaknesses Behavioral/Psych: Mood is stable, no new changes  All other systems were reviewed with the patient and are negative.  MEDICAL HISTORY:  Past Medical History:  Diagnosis Date   Anticardiolipin antibody positive    Heterozygous MTHFR mutation C677T    Vaginal Pap smear, abnormal     SURGICAL HISTORY: Past Surgical History:  Procedure Laterality Date   DILATION AND EVACUATION N/A 03/16/2016   Procedure: Ultrasound guided DILATATION AND EVACUATION;  Surgeon: Maxie Better, MD;  Location: [facility];  Service: Gynecology;  Laterality: N/A;   NO PAST SURGERIES      SOCIAL HISTORY: Social History   Socioeconomic History   Marital status: Married    Spouse name: Not on file   Number of children: Not on file   Years of education: Not on file   Highest education level: Not on file  Occupational History   Not on file  Tobacco Use   Smoking status: Former    Types: Cigarettes   Smokeless tobacco: Never  Vaping Use   Vaping status: Never Used  Substance and Sexual Activity   Alcohol use: No   Drug use: No   Sexual activity: Not Currently    Partners: Male    Birth control/protection: Abstinence    Comment: 1st intercourse- 55,  Other Topics Concern   Not on file  Social History Narrative   Not on file   Social Determinants of Health   Financial Resource Strain: Not on file  Food Insecurity: Not on file   Transportation Needs: Not on file  Physical Activity: Not on file  Stress: Not on file  Social Connections: Not on file  Intimate Partner Violence: Not on file    FAMILY HISTORY: Family History  Problem Relation Age of Onset   Hypertension Father    Cancer Sister        unsure exactly what kind; but had hysterectomy   Esophageal cancer Maternal Uncle    Breast cancer Paternal Aunt 42 - 12   COPD Paternal Aunt    COPD Maternal Grandfather    Diabetes Maternal Grandfather     ALLERGIES:  has No Known Allergies.  MEDICATIONS:  Current Outpatient Medications  Medication Sig Dispense Refill   ASPIRIN 81 PO Take 81 mg by mouth daily.     No current facility-administered medications for this visit.     PHYSICAL EXAMINATION: ECOG PERFORMANCE STATUS: 0 - Asymptomatic  There were no vitals filed for this visit.  There were no vitals filed for this visit.  Physical exam deferred, telephone visit  LABORATORY DATA:  I have reviewed the data as listed Lab Results  Component Value Date   WBC 5.0 07/31/2018   HGB 13.1 07/31/2018   HCT 38.4 07/31/2018   MCV 95.8 07/31/2018   PLT 342 07/31/2018     Chemistry      Component Value Date/Time   NA 137 04/04/2018 0955   K 4.4 04/04/2018 0955   CL 102 04/04/2018 0955   CO2 30 04/04/2018 0955   BUN 14 04/04/2018 0955   CREATININE 0.83 04/04/2018 0955      Component Value Date/Time   CALCIUM 9.1 04/04/2018 0955   AST 11 04/04/2018 0955   ALT 7 04/04/2018 0955   BILITOT 0.4 04/04/2018 0955       RADIOGRAPHIC STUDIES: I have personally reviewed the radiological images as listed and agreed with the findings in the report. No results found.  I connected with  Julia Santos on 10/05/22 by a telephone application and verified that I am speaking with the correct person using two identifiers.   I discussed the limitations of evaluation and management by telemedicine. The patient expressed understanding and agreed to  proceed.   All questions were answered. The patient knows to call the clinic with any problems, questions or concerns. I spent 8 minutes in the care of this patient including H and P, review of records, counseling and coordination of care.     Rachel Moulds, MD 10/05/2022 9:05 AM

## 2022-10-26 ENCOUNTER — Institutional Professional Consult (permissible substitution): Payer: Managed Care, Other (non HMO) | Admitting: Plastic Surgery

## 2022-10-29 ENCOUNTER — Ambulatory Visit
Admission: RE | Admit: 2022-10-29 | Discharge: 2022-10-29 | Disposition: A | Discharge status: Home / Self Care | Payer: Managed Care, Other (non HMO) | Source: Ambulatory Visit | Attending: Family Medicine

## 2022-10-29 DIAGNOSIS — Z1231 Encounter for screening mammogram for malignant neoplasm of breast: Secondary | ICD-10-CM

## 2022-11-06 ENCOUNTER — Encounter: Payer: Self-pay | Admitting: Plastic Surgery

## 2022-11-06 ENCOUNTER — Ambulatory Visit: Payer: Managed Care, Other (non HMO) | Admitting: Plastic Surgery

## 2022-11-06 VITALS — BP 99/67 | HR 68 | Ht 67.0 in | Wt 147.2 lb

## 2022-11-06 DIAGNOSIS — L723 Sebaceous cyst: Secondary | ICD-10-CM | POA: Insufficient documentation

## 2022-11-06 DIAGNOSIS — L989 Disorder of the skin and subcutaneous tissue, unspecified: Secondary | ICD-10-CM | POA: Insufficient documentation

## 2022-11-06 NOTE — Progress Notes (Signed)
Patient ID: Julia Santos, female    DOB: 1980-10-21, 42 y.o.   MRN: [id number]   Chief Complaint  Patient presents with   Advice Only    The patient is a 42 year old female here for evaluation of her neck and right arm.  The patient states she has had a nodule on the posterior right neck area for couple of years.  It has started to get larger and is sometimes painful.  It is slightly firm but mobile.  She does not have any other areas of concern in the posterior or anterior neck that would be concerning for lymphadenopathy.  This is about 1-1/2 cm in size and feels like a sebaceous cyst.  There is a little bit of skin discoloration that is just medial to the area.  She has a lesion on her right arm that looks and feels like a dermatofibroma.  Its about a centimeter slightly red in color firm movable and getting larger.     Review of Systems  Constitutional: Negative.   HENT: Negative.    Eyes: Negative.   Respiratory: Negative.    Cardiovascular: Negative.   Gastrointestinal: Negative.   Endocrine: Negative.   Genitourinary: Negative.   Musculoskeletal: Negative.   Skin:  Positive for color change.    Past Medical History:  Diagnosis Date   Anticardiolipin antibody positive    Heterozygous MTHFR mutation C677T    Vaginal Pap smear, abnormal     Past Surgical History:  Procedure Laterality Date   DILATION AND EVACUATION N/A 03/16/2016   Procedure: Ultrasound guided DILATATION AND EVACUATION;  Surgeon: Maxie Better, MD;  Location: [facility];  Service: Gynecology;  Laterality: N/A;   NO PAST SURGERIES        Current Outpatient Medications:    ASPIRIN 81 PO, Take 81 mg by mouth daily., Disp: , Rfl:    Objective:   Vitals:   11/06/22 0856  BP: 99/67  Pulse: 68  SpO2: 100%    Physical Exam Vitals and nursing note reviewed.  Constitutional:      Appearance: Normal appearance.  HENT:     Head: Normocephalic and atraumatic.   Cardiovascular:     Rate and Rhythm:  Normal rate.     Pulses: Normal pulses.  Pulmonary:     Effort: Pulmonary effort is normal.  Abdominal:     Palpations: Abdomen is soft.  Musculoskeletal:        General: No swelling or signs of injury.       Arms:  Skin:    General: Skin is warm.     Capillary Refill: Capillary refill takes less than 2 seconds.     Coloration: Skin is not jaundiced.     Findings: Lesion present. No bruising.  Neurological:     Mental Status: She is alert and oriented to person, place, and time.  Psychiatric:        Mood and Affect: Mood normal.        Behavior: Behavior normal.        Thought Content: Thought content normal.        Judgment: Judgment normal.     Assessment & Plan:  Changing skin lesion  Sebaceous cyst  Plan for office excision of right posterior neck sebaceous cyst and right arm changing skin lesion.  Patient is aware she will have scars and we discussed what those would potentially look like.  If the arm is a hypertrophic scar then she could end up with a  larger scar but it appears to be more of a lesion.  Pictures were obtained of the patient and placed in the chart with the patient's or guardian's permission.   Alena Bills Micheale Schlack, DO

## 2023-01-22 ENCOUNTER — Encounter: Payer: Self-pay | Admitting: Plastic Surgery

## 2023-01-22 ENCOUNTER — Ambulatory Visit: Payer: Managed Care, Other (non HMO) | Admitting: Plastic Surgery

## 2023-01-22 ENCOUNTER — Other Ambulatory Visit (HOSPITAL_COMMUNITY)
Admission: RE | Admit: 2023-01-22 | Discharge: 2023-01-22 | Disposition: A | Discharge status: Home / Self Care | Payer: Managed Care, Other (non HMO) | Source: Ambulatory Visit | Attending: Plastic Surgery | Admitting: Plastic Surgery

## 2023-01-22 VITALS — BP 115/71 | HR 82

## 2023-01-22 DIAGNOSIS — L989 Disorder of the skin and subcutaneous tissue, unspecified: Secondary | ICD-10-CM | POA: Diagnosis present

## 2023-01-22 DIAGNOSIS — D2361 Other benign neoplasm of skin of right upper limb, including shoulder: Secondary | ICD-10-CM | POA: Diagnosis not present

## 2023-01-22 DIAGNOSIS — L723 Sebaceous cyst: Secondary | ICD-10-CM

## 2023-01-22 NOTE — Progress Notes (Addendum)
 Procedure Note  Preoperative Dx:  Right arm changing skin lesion Right posterior neck sebaceous cyst  Postoperative Dx: Same  Procedure:  Excision of right arm skin lesion 2 cm Excision of right posterior neck sebaceous cyst 1.5 cm  Anesthesia: Lidocaine  1% with 1:100,000 epinephrine  Description of Procedure: Risks and complications were explained to the patient.  Consent was confirmed and the patient understands the risks and benefits.  The potential complications and alternatives were explained and the patient consents.  The patient expressed understanding the option of not having the procedure and the risks of a scar.  Time out was called and all information was confirmed to be correct.    Right arm: The area was prepped and drapped.  Lidocaine  1% with epinephrine was injected in the subcutaneous area.  After waiting several minutes for the local to take affect a #15 blade was used to excise the area in an eliptical pattern.  A 4-0 Monocryl was used to close the deep layers with simple interrupted stitches.  The skin edges were reapproximated with 6-0 Monocryl subcuticular running closure.    Posterior neck: The area was prepped and drapped.  Lidocaine  1% with epinephrine was injected in the subcutaneous area.  After waiting several minutes for the local to take affect a #15 blade was used to incise the skin over the area.  The entire lesion was excised.  The skin edges were reapproximated with 6-0 Monocryl.   A dressing was applied.  The patient was given instructions on how to care for the area and a follow up appointment.  Julia Santos tolerated the procedure well and there were no complications. The specimen was sent to pathology.

## 2023-01-28 ENCOUNTER — Ambulatory Visit: Payer: Managed Care, Other (non HMO) | Admitting: Surgical

## 2023-01-28 VITALS — BP 136/71 | HR 67 | Temp 97.9°F

## 2023-01-28 DIAGNOSIS — L723 Sebaceous cyst: Secondary | ICD-10-CM

## 2023-01-28 NOTE — Progress Notes (Signed)
Patient is a 43 year old female here for evaluation of her right neck.  She underwent excision of right posterior neck cyst with Dr. Ulice Bold 6 days ago.  She reports that she has noticed a lot of swelling, increased pain and tenderness of her right neck.  BP 136/71 (BP Location: Left Arm, Patient Position: Sitting, Cuff Size: Normal)   Pulse 67   Temp 97.9 F (36.6 C)   SpO2 99%   On exam right posterior cyst is erythematous, there is some surrounding swelling and fluctuance present.  It is tender with palpation.  Monocryl sutures are noted.  Patient is well-developed, well-nourished, no acute distress.  Breathing is unlabored.  A/P:  Purulence and cystic material was expressed from the right posterior neck cyst excision site as well as 2 additional pinpoint wounds present.  This significantly improved the fullness and fluctuance noted of the right neck.   There was more cystic material than there was purulence, recommend warm compresses to the area 1-2 times daily.  Recommend Vashe soaked gauze placed over the area 1-2 times per day.  Recommend continuing to closely monitor the area.  We discussed possibility for antibiotics, however patient reports that she recently was on amoxicillin and this caused her significant GI complications for about 4 weeks.  She would like to avoid antibiotics if possible, I think it is reasonable to continue to follow and watch closely given that we were able to fully express all the cystic material/purulence.  Pictures were obtained of the patient and placed in the chart with the patient's or guardian's permission.

## 2023-01-29 LAB — SURGICAL PATHOLOGY

## 2023-02-01 ENCOUNTER — Ambulatory Visit: Payer: Managed Care, Other (non HMO) | Admitting: Surgical

## 2023-02-01 DIAGNOSIS — L989 Disorder of the skin and subcutaneous tissue, unspecified: Secondary | ICD-10-CM

## 2023-02-01 DIAGNOSIS — L723 Sebaceous cyst: Secondary | ICD-10-CM

## 2023-02-01 NOTE — Progress Notes (Signed)
Patient is a 43 year old female here for evaluation of her right neck and right arm after excision of right posterior neck cyst and excision of right arm skin lesion with Dr. Ulice Bold on 01/22/2023.  Pathology showed that the right arm lesion was a dermatofibroma, margins are free.  Patient was last seen in the office on 01/28/2023 for concerns related to her right posterior neck surgical site having increased swelling, pain and tenderness.  Purulence and cystic material was able to be expressed from the excision site  Patient reports she has been doing much better since then, she is not having any infectious symptoms.  Pain is significantly improved and the redness has significantly improved as well.  She does have some questions about the cystic material that was expressed.  She has questions about if she possibly has another cyst there that may need to be excised in the future. She reports she still feels some fullness in that area.  Chaperone present on exam On exam right posterior neck cyst excision site with some mild scabbing present.  Monocryl suture knots noted.  There is some mild surrounding redness, but no fluctuance or cellulitic changes noted.  It is minimally tender.  She does have some fullness present deep to the incision.  Minimal drainage noted with palpation.  Right arm incision with Steri-Strip in place, Monocryl suture knots noted.  Incision is healing well.  No surrounding erythema or cellulitic changes. Mild ecchymosis noted inferior medially.  A/P:  Patient is doing much better, right arm incision is healing well, right posterior neck incision is healing well.  She still has some redness present, suspect this will continue to improve.  Do not feel as if any additional fluctuance or purulence is present.  Recommend continuing with warm compresses for additional 2 days.  Recommend closely monitoring the area, follow-up if she notices any worsening of the surgical site.  We  discussed that the fullness she is noticing beneath the excision site on the right posterior neck is possibly some postsurgical swelling/inflammation, however it could also be another cyst that is deep to the previous cyst given the cystic material that was able to be expressed from the surgical site at her last appointment.  Patient was understanding of this, we discussed scheduling a follow-up in a few months to reevaluate the right posterior neck area to determine if additional cyst excision will be necessary.  Pictures were obtained of the patient and placed in the chart with the patient's or guardian's permission.

## 2023-04-16 ENCOUNTER — Ambulatory Visit: Payer: Managed Care, Other (non HMO) | Admitting: Plastic Surgery

## 2023-04-22 ENCOUNTER — Ambulatory Visit: Admitting: Radiology

## 2023-04-22 ENCOUNTER — Encounter: Payer: Self-pay | Admitting: Radiology

## 2023-04-22 VITALS — BP 110/64 | HR 90 | Wt 144.0 lb

## 2023-04-22 DIAGNOSIS — R102 Pelvic and perineal pain: Secondary | ICD-10-CM

## 2023-04-22 NOTE — Progress Notes (Signed)
   Julia Santos 1980/05/06 [id number]   History:  43 y.o. G4P1 presents with c/o worsening periods, heavy, large clots and pain. Now has cramping and bloating even without her periods. Negative GI workup.   Gynecologic History Patient's last menstrual period was 03/28/2023. Period Cycle (Days): 25 (25-30) Period Duration (Days): 10 Period Pattern: Regular Menstrual Flow: Heavy Menstrual Control: Tampon, Maxi pad (her last period she had large clots.) Menstrual Control Change Freq (Hours): 2-3 Dysmenorrhea: (!) Moderate (she has been having cramping between periods.) Dysmenorrhea Symptoms: Cramping Contraception/Family planning: condoms Sexually active: yes Last Pap: 09/2022. Results were: normal Last mammogram: 10/24. Results were: normal  Obstetric History OB History  Gravida Para Term Preterm AB Living  4 1 1  3 1   SAB IAB Ectopic Multiple Live Births  3   0 1    # Outcome Date GA Lbr Len/2nd Weight Sex Type Anes PTL Lv  4 Term 03/01/16 [redacted]w[redacted]d 11:56 / 00:37 6 lb 8.4 oz (2.96 kg) F Vag-Spont EPI  LIV  3 SAB           2 SAB           1 SAB                 No data to display           The following portions of the patient's history were reviewed and updated as appropriate: allergies, current medications, past family history, past medical history, past social history, past surgical history, and problem list.  Review of Systems  All other systems reviewed and are negative.   Past medical history, past surgical history, family history and social history were all reviewed and documented in the EPIC chart.  Exam:  Vitals:   04/22/23 0827  BP: 110/64  Pulse: 90  SpO2: 100%  Weight: 144 lb (65.3 kg)   Body mass index is 22.55 kg/m.  Physical Exam Vitals and nursing note reviewed. Exam conducted with a chaperone present.  Constitutional:      Appearance: Normal appearance. She is well-developed.  Pulmonary:     Effort: Pulmonary effort is normal.  Abdominal:      General: Abdomen is flat.     Palpations: Abdomen is soft.  Genitourinary:    General: Normal vulva.     Vagina: No vaginal discharge, erythema, bleeding or lesions.     Cervix: Normal. No discharge, friability, lesion or erythema.     Uterus: Normal.      Adnexa: Right adnexa normal and left adnexa normal.  Neurological:     Mental Status: She is alert.  Psychiatric:        Mood and Affect: Mood normal.        Thought Content: Thought content normal.        Judgment: Judgment normal.      Ellis Guys, CMA present for exam  Assessment/Plan:   1. 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  Synetta Eves B [facility]NP-BC 8:45 AM 04/22/2023
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
## 2023-04-29 ENCOUNTER — Other Ambulatory Visit: Payer: Self-pay | Admitting: Neurology

## 2023-04-29 ENCOUNTER — Encounter: Payer: Self-pay | Admitting: Neurology

## 2023-04-29 ENCOUNTER — Ambulatory Visit: Payer: Managed Care, Other (non HMO) | Admitting: Neurology

## 2023-04-29 VITALS — BP 97/63 | HR 83 | Ht 67.0 in | Wt 145.0 lb

## 2023-04-29 DIAGNOSIS — G2569 Other tics of organic origin: Secondary | ICD-10-CM | POA: Insufficient documentation

## 2023-04-29 MED ORDER — ARIPIPRAZOLE 2 MG PO TABS: 4.0000 mg | ORAL_TABLET | Freq: Every day | ORAL | 6 refills | Status: DC

## 2023-04-29 NOTE — Progress Notes (Signed)
 Chief Complaint  Patient presents with   Room 14    Pt is here Alone. Pt states that her tic's are everyday, but doesn't last all day. Pt states that she has obsessive blinking and sniffing. Pt states that she flex her neck all of the time which is causing her pain in her neck. Pt states that she will have some throat clearing tics but that isn't consistent. Pt states that when she is sick her body seems to calm down compared to when she is well. Pt states that she has pain her left arm that goes all the way down that will kick off her tics sometimes.       ASSESSMENT AND PLAN  Julia Santos is a 43 y.o. female   Tourette syndrome  Discussed treatment options, decided to start low-dose Abilify  2  mg titrating to 4 mg daily  Lab evaluation including CMP, CBC, TSH  DIAGNOSTIC DATA (LABS, IMAGING, TESTING) - I reviewed patient records, labs, notes, testing and imaging myself where available.   MEDICAL HISTORY:  Julia Santos is a 43 year old female, seen in request by her primary care from [facility] Dr. Barbar Bonus, Kayleen Party, for evaluation of fatigue, initial evaluation was on April 29, 2023    History is obtained from the patient and review of electronic medical records. I personally reviewed pertinent available imaging films in PACS.   PMHx of  Family history of blood clot  She has long history of tics, started before age 41, different abnormal movement involving over the years, eye blinking, shoulder shrugging, now she is most bothered by sudden neck jerking movement, to the point of neck pain, grunting sound, also has frequent grimace, sniffing  She has been dealing with anxiety for many years, used to have frequent panic attacks, now improved, no longer taking Xanax regularly   PHYSICAL EXAM:   Vitals:   04/29/23 1107  BP: 97/63  Pulse: 83  Weight: 145 lb (65.8 kg)  Height: 5\' 7"  (1.702 m)      Body mass index is 22.71 kg/m.  PHYSICAL EXAMNIATION:  Gen: NAD, conversant, well  nourised, well groomed                     Cardiovascular: Regular rate rhythm, no peripheral edema, warm, nontender. Eyes: Conjunctivae clear without exudates or hemorrhage Neck: Supple, no carotid bruits. Pulmonary: Clear to auscultation bilaterally   NEUROLOGICAL EXAM:  MENTAL STATUS: Speech/cognition: Awake, alert, oriented to history taking and casual conversation, occasionally facial grimace, neck jerking movement CRANIAL NERVES: CN II: Visual fields are full to confrontation. Pupils are round equal and briskly reactive to light. CN III, IV, VI: extraocular movement are normal. No ptosis. CN V: Facial sensation is intact to light touch CN VII: Face is symmetric with normal eye closure  CN VIII: Hearing is normal to causal conversation. CN IX, X: Phonation is normal. CN XI: Head turning and shoulder shrug are intact  MOTOR: There is no pronator drift of out-stretched arms. Muscle bulk and tone are normal. Muscle strength is normal.  REFLEXES: Reflexes are 2+ and symmetric at the biceps, triceps, knees, and ankles. Plantar responses are flexor.  SENSORY: Intact to light touch, pinprick and vibratory sensation are intact in fingers and toes.  COORDINATION: There is no trunk or limb dysmetria noted.  GAIT/STANCE: Posture is normal. Gait is steady with normal steps, base, arm swing, and turning. Heel and toe walking are normal. Tandem gait is normal.  Romberg is  absent.  REVIEW OF SYSTEMS:  Full 14 system review of systems performed and notable only for as above All other review of systems were negative.   ALLERGIES: Allergies  Allergen Reactions   Amoxicillin     Other Reaction(s): constipation/loose stools/abdominal pain    HOME MEDICATIONS: Current Outpatient Medications  Medication Sig Dispense Refill   ASPIRIN 81 PO Take 81 mg by mouth daily.     No current facility-administered medications for this visit.    PAST MEDICAL HISTORY: Past Medical History:   Diagnosis Date   Anticardiolipin antibody positive    Heterozygous MTHFR mutation C677T    Vaginal Pap smear, abnormal     PAST SURGICAL HISTORY: Past Surgical History:  Procedure Laterality Date   DILATION AND EVACUATION N/A 03/16/2016   Procedure: Ultrasound guided DILATATION AND EVACUATION;  Surgeon: Ivery Marking, MD;  Location: [facility];  Service: Gynecology;  Laterality: N/A;   NO PAST SURGERIES      FAMILY HISTORY: Family History  Problem Relation Age of Onset   Hypertension Father    Cancer Sister        unsure exactly what kind; but had hysterectomy   Esophageal cancer Maternal Uncle    Breast cancer Paternal Aunt 41 - 2   COPD Paternal Aunt    COPD Maternal Grandfather    Diabetes Maternal Grandfather     SOCIAL HISTORY: Social History   Socioeconomic History   Marital status: Married    Spouse name: Not on file   Number of children: Not on file   Years of education: Not on file   Highest education level: Not on file  Occupational History   Not on file  Tobacco Use   Smoking status: Former    Types: Cigarettes   Smokeless tobacco: Never  Vaping Use   Vaping status: Never Used  Substance and Sexual Activity   Alcohol use: No   Drug use: No   Sexual activity: Not Currently    Partners: Male    Birth control/protection: Abstinence    Comment: 1st intercourse- 15,  Other Topics Concern   Not on file  Social History Narrative   Not on file   Social Drivers of Health   Financial Resource Strain: Not on file  Food Insecurity: Not on file  Transportation Needs: Not on file  Physical Activity: Not on file  Stress: Not on file  Social Connections: Not on file  Intimate Partner Violence: Not on file      Phebe Brasil, M.D. Ph.D.  [facility] [address] [place], [place] [address] Ph: [phone] Fax: [phone]  CC:  Dorena Gander, MD [phone] [address] [place],  [place] [address]  Dorena Gander,  MD

## 2023-04-30 ENCOUNTER — Telehealth: Payer: Self-pay

## 2023-04-30 ENCOUNTER — Other Ambulatory Visit (HOSPITAL_COMMUNITY): Payer: Self-pay

## 2023-04-30 LAB — COMPREHENSIVE METABOLIC PANEL WITH GFR
ALT: 13 IU/L (ref 0–32)
AST: 16 IU/L (ref 0–40)
Albumin: 4.7 g/dL (ref 3.9–4.9)
Alkaline Phosphatase: 44 IU/L (ref 44–121)
BUN/Creatinine Ratio: 14 (ref 9–23)
BUN: 11 mg/dL (ref 6–24)
Bilirubin Total: 0.3 mg/dL (ref 0.0–1.2)
CO2: 25 mmol/L (ref 20–29)
Calcium: 9.5 mg/dL (ref 8.7–10.2)
Chloride: 102 mmol/L (ref 96–106)
Creatinine, Ser: 0.77 mg/dL (ref 0.57–1.00)
Globulin, Total: 2.7 g/dL (ref 1.5–4.5)
Glucose: 89 mg/dL (ref 70–99)
Potassium: 4.4 mmol/L (ref 3.5–5.2)
Sodium: 140 mmol/L (ref 134–144)
Total Protein: 7.4 g/dL (ref 6.0–8.5)
eGFR: 98 mL/min/{1.73_m2} (ref >59)

## 2023-04-30 LAB — CBC WITH DIFFERENTIAL/PLATELET
Basophils Absolute: 0 10*3/uL (ref 0.0–0.2)
Basos: 1 %
EOS (ABSOLUTE): 0.1 10*3/uL (ref 0.0–0.4)
Eos: 2 %
Hematocrit: 38.9 % (ref 34.0–46.6)
Hemoglobin: 13.3 g/dL (ref 11.1–15.9)
Immature Grans (Abs): 0 10*3/uL (ref 0.0–0.1)
Immature Granulocytes: 0 %
Lymphocytes Absolute: 2 10*3/uL (ref 0.7–3.1)
Lymphs: 35 %
MCH: 32.9 pg (ref 26.6–33.0)
MCHC: 34.2 g/dL (ref 31.5–35.7)
MCV: 96 fL (ref 79–97)
Monocytes Absolute: 0.3 10*3/uL (ref 0.1–0.9)
Monocytes: 5 %
Neutrophils Absolute: 3.2 10*3/uL (ref 1.4–7.0)
Neutrophils: 57 %
Platelets: 373 10*3/uL (ref 150–450)
RBC: 4.04 x10E6/uL (ref 3.77–5.28)
RDW: 11.8 % (ref 11.7–15.4)
WBC: 5.6 10*3/uL (ref 3.4–10.8)

## 2023-04-30 LAB — TSH: TSH: 3.04 u[IU]/mL (ref 0.450–4.500)

## 2023-04-30 NOTE — Telephone Encounter (Signed)
 Pharmacy Patient Advocate Encounter   Received notification from RX Request Messages that prior authorization for ARIPiprazole  2MG  tablets is required/requested.   Insurance verification completed.   The patient is insured through [employer] .   Per test claim: PA required; PA submitted to above mentioned insurance via CoverMyMeds Key/confirmation #/EOC [id number] Status is pending

## 2023-05-01 ENCOUNTER — Encounter: Payer: Self-pay | Admitting: Neurology

## 2023-05-07 ENCOUNTER — Other Ambulatory Visit (HOSPITAL_COMMUNITY): Payer: Self-pay

## 2023-05-07 NOTE — Telephone Encounter (Signed)
 Pharmacy Patient Advocate Encounter  Received notification from [employer] that Prior Authorization for ARIPiprazole  2MG  tablets has been APPROVED from 03/31/2023 to 05/30/2023. Ran test claim, Copay is $10.00. This test claim was processed through [facility]- copay amounts may vary at other pharmacies due to pharmacy/plan contracts, or as the patient moves through the different stages of their insurance plan.   PA #/Case ID/Reference #: PA Case ID #: [id number]

## 2023-05-08 HISTORY — PX: COLONOSCOPY WITH PROPOFOL: SHX5780

## 2023-05-15 ENCOUNTER — Encounter: Payer: Self-pay | Admitting: Radiology

## 2023-05-15 ENCOUNTER — Ambulatory Visit (INDEPENDENT_AMBULATORY_CARE_PROVIDER_SITE_OTHER): Admitting: Radiology

## 2023-05-15 ENCOUNTER — Other Ambulatory Visit: Payer: Self-pay | Admitting: Radiology

## 2023-05-15 ENCOUNTER — Ambulatory Visit (INDEPENDENT_AMBULATORY_CARE_PROVIDER_SITE_OTHER)

## 2023-05-15 VITALS — BP 102/64 | HR 64

## 2023-05-15 DIAGNOSIS — N83292 Other ovarian cyst, left side: Secondary | ICD-10-CM

## 2023-05-15 DIAGNOSIS — D219 Benign neoplasm of connective and other soft tissue, unspecified: Secondary | ICD-10-CM | POA: Diagnosis not present

## 2023-05-15 DIAGNOSIS — R102 Pelvic and perineal pain: Secondary | ICD-10-CM | POA: Diagnosis not present

## 2023-05-15 DIAGNOSIS — N920 Excessive and frequent menstruation with regular cycle: Secondary | ICD-10-CM | POA: Diagnosis not present

## 2023-05-15 MED ORDER — NORETHINDRONE 0.35 MG PO TABS: 1.0000 | ORAL_TABLET | Freq: Every day | ORAL | 1 refills | Status: DC

## 2023-05-15 NOTE — Progress Notes (Signed)
   Julia Santos 07/01/1980 [id number]   History:  43 y.o. G4P1 presents for ultrasound. She presented last month with c/o worsening periods, heavy, large clots and pain. Now has cramping and bloating even without her periods. Negative GI workup.   Gynecologic History Patient's last menstrual period was 04/29/2023 (exact date).   Contraception/Family planning: condoms Sexually active: yes Last Pap: 09/2022. Results were: normal Last mammogram: 10/24. Results were: normal  Obstetric History OB History  Gravida Para Term Preterm AB Living  4 1 1  3 1   SAB IAB Ectopic Multiple Live Births  3   0 1    # Outcome Date GA Lbr Len/2nd Weight Sex Type Anes PTL Lv  4 Term 03/01/16 [redacted]w[redacted]d 11:56 / 00:37 6 lb 8.4 oz (2.96 kg) F Vag-Spont EPI  LIV  3 SAB           2 SAB           1 SAB                The following portions of the patient's history were reviewed and updated as appropriate: allergies, current medications, past family history, past medical history, past social history, past surgical history, and problem list.  Review of Systems  All other systems reviewed and are negative.   Past medical history, past surgical history, family history and social history were all reviewed and documented in the EPIC chart.  Exam:  Vitals:   05/15/23 1041  BP: 102/64  Pulse: 64  SpO2: 99%   There is no height or weight on file to calculate BMI.  Reading Physician Reading Date Result Priority  Laine Piggs, [place] [phone]    05/15/2023 Routine   Narrative & Impression Indication: pelvic pain and bloating   Vaginal ultrasound   Anteverted size and shape 8.13 x 6.29  x 4.99 cm 3 fibroids seen 1.3 x .76, 0.81 x 0.55, 0.90 x 0.45cm   Thin symmetrical endometrium 7.19 mm   Right ovary volume 3.588cm  WNL with normal perfusion Left ovary volume 14.383cm  21x 20mm complex avascular cyst seen transabdominally      No adnexal masses No free fluid   Impression:fibroids, complex  cyst of left ovary, repeat imagine in 10 weeks            Exam Ended: 05/15/23 10:37 Last Resulted: 05/15/23 10:56    Assessment/Plan:   1. Complex cyst of left ovary (Primary) - US  Transvaginal Non-OB; Future  2. Fibroids Discussed treated with POPs vs IUD, prefers to try POPs - norethindrone (ORTHO MICRONOR) 0.35 MG tablet; Take 1 tablet (0.35 mg total) by mouth daily.  Dispense: 84 tablet; Refill: 1  3. Menorrhagia with regular cycle - norethindrone (ORTHO MICRONOR) 0.35 MG tablet; Take 1 tablet (0.35 mg total) by mouth daily.  Dispense: 84 tablet; Refill: 1   Return 10 weeks u/s then JC.  Synetta Eves B WHNP-BC 10:57 AM 05/15/2023

## 2023-06-19 ENCOUNTER — Encounter: Payer: Self-pay | Admitting: Obstetrics and Gynecology

## 2023-06-19 ENCOUNTER — Ambulatory Visit: Admitting: Obstetrics and Gynecology

## 2023-06-19 VITALS — BP 112/72 | HR 77

## 2023-06-19 DIAGNOSIS — N6320 Unspecified lump in the left breast, unspecified quadrant: Secondary | ICD-10-CM | POA: Diagnosis not present

## 2023-06-19 DIAGNOSIS — N6310 Unspecified lump in the right breast, unspecified quadrant: Secondary | ICD-10-CM | POA: Diagnosis not present

## 2023-06-19 DIAGNOSIS — N951 Menopausal and female climacteric states: Secondary | ICD-10-CM

## 2023-06-19 NOTE — Progress Notes (Signed)
   Acute Office Visit  Subjective:    Patient ID: Julia Santos, female    DOB: [birth year], 43 y.o.   MRN: [id number]   HPI 43 y.o. presents today for breast lump] (Bilateral Breast lump/tenderness//jj) .  Patient's last menstrual period was 06/18/2023 (exact date). Period Duration (Days): 6-7 Period Pattern: Regular Menstrual Flow: Heavy Menstrual Control: Maxi pad, Tampon Dysmenorrhea:  (cramping)  Review of Systems  Patient with heavy, painful periods.  Tried progesterone and started feeling breast swelling and lumps at 1oclock on the left and 10 oclock on the right. No discharge Sister with uterine cancer She stopped the birth control and started bleeding heavily, but would like to do a work-up.  She is bothered by her periods and does not want to be on hormones. Patient is also having bothersome hot flashes    Objective:     OBGyn Exam  BP 112/72   Pulse 77   LMP 06/18/2023 (Exact Date)   SpO2 99%  Wt Readings from Last 3 Encounters:  04/29/23 145 lb (65.8 kg)  04/22/23 144 lb (65.3 kg)  11/06/22 147 lb 3.2 oz (66.8 kg)        Jada was present for the breast exam  Breast: palpable mobile density on each breast. No dischar  Assessment & Plan:  Breast masses b/l Menorrhagia Dysmenorrhea Fibroids Does not desire conception Stopped ocp's for now Vasomotor symptoms Ovarian cyst  To get MMG with breast US  All options discussed with bothersome periods.  She would like to consider the [facility].  Counseled on the r/b/a/I. Information was given.   To get labs today to evaluate hot flashes Repeat PUS in 10 weeks from last one with Jami Dr. Caro Christmas

## 2023-06-20 ENCOUNTER — Other Ambulatory Visit: Payer: Self-pay | Admitting: Obstetrics and Gynecology

## 2023-06-20 ENCOUNTER — Encounter: Payer: Self-pay | Admitting: Obstetrics and Gynecology

## 2023-06-20 ENCOUNTER — Ambulatory Visit: Payer: Self-pay | Admitting: Obstetrics and Gynecology

## 2023-06-20 DIAGNOSIS — N6310 Unspecified lump in the right breast, unspecified quadrant: Secondary | ICD-10-CM

## 2023-06-20 NOTE — Telephone Encounter (Signed)
 Referral faxed to [facility].

## 2023-06-23 LAB — VITAMIN D 25 HYDROXY (VIT D DEFICIENCY, FRACTURES): Vit D, 25-Hydroxy: 28 ng/mL — ABNORMAL LOW (ref 30–100)

## 2023-06-23 LAB — TESTOS,TOTAL,FREE AND SHBG (FEMALE)
Free Testosterone: 1.8 pg/mL (ref 0.1–6.4)
Sex Hormone Binding: 66 nmol/L (ref 17–124)
Testosterone, Total, LC-MS-MS: 21 ng/dL (ref 2–45)

## 2023-06-23 LAB — LIPID PANEL
Cholesterol: 164 mg/dL (ref <200)
HDL: 71 mg/dL (ref >=50)
LDL Cholesterol (Calc): 81 mg/dL
Non-HDL Cholesterol (Calc): 93 mg/dL (ref <130)
Total CHOL/HDL Ratio: 2.3 (calc) (ref <5.0)
Triglycerides: 50 mg/dL (ref <150)

## 2023-06-23 LAB — ESTRADIOL: Estradiol: 65 pg/mL

## 2023-06-23 LAB — FOLLICLE STIMULATING HORMONE: FSH: 11.5 m[IU]/mL

## 2023-06-25 ENCOUNTER — Ambulatory Visit
Admission: RE | Admit: 2023-06-25 | Discharge: 2023-06-25 | Discharge status: Home / Self Care | Source: Ambulatory Visit | Attending: Obstetrics and Gynecology

## 2023-06-25 ENCOUNTER — Ambulatory Visit
Admission: RE | Admit: 2023-06-25 | Discharge: 2023-06-25 | Disposition: A | Discharge status: Home / Self Care | Source: Ambulatory Visit | Attending: Obstetrics and Gynecology | Admitting: Obstetrics and Gynecology

## 2023-06-25 ENCOUNTER — Ambulatory Visit: Payer: Self-pay | Admitting: Obstetrics and Gynecology

## 2023-06-25 DIAGNOSIS — N6310 Unspecified lump in the right breast, unspecified quadrant: Secondary | ICD-10-CM

## 2023-07-24 ENCOUNTER — Other Ambulatory Visit: Payer: Self-pay | Admitting: Radiology

## 2023-07-24 ENCOUNTER — Encounter: Payer: Self-pay | Admitting: Radiology

## 2023-07-24 ENCOUNTER — Ambulatory Visit: Admitting: Radiology

## 2023-07-24 ENCOUNTER — Ambulatory Visit (INDEPENDENT_AMBULATORY_CARE_PROVIDER_SITE_OTHER)

## 2023-07-24 VITALS — BP 102/64 | Wt 150.6 lb

## 2023-07-24 DIAGNOSIS — N920 Excessive and frequent menstruation with regular cycle: Secondary | ICD-10-CM | POA: Diagnosis not present

## 2023-07-24 DIAGNOSIS — R102 Pelvic and perineal pain: Secondary | ICD-10-CM

## 2023-07-24 DIAGNOSIS — N83292 Other ovarian cyst, left side: Secondary | ICD-10-CM | POA: Diagnosis not present

## 2023-07-24 DIAGNOSIS — D219 Benign neoplasm of connective and other soft tissue, unspecified: Secondary | ICD-10-CM

## 2023-07-24 NOTE — Progress Notes (Signed)
   Julia Santos 10/07/1980 [id number]   History:  43 y.o. G4P1 presents for follow up ultrasound. She presented in April with c/o worsening periods, heavy, large clots and pain. Now has cramping and bloating even without her periods. Negative GI workup. U/S on 05/15/23 showed a complex left ovarian cyst. She was tried on POPs which helped the pelvic pain and bleeding, however caused tinnitus so she stopped.  Gynecologic History Patient's last menstrual period was 07/14/2023.   Contraception/Family planning: condoms Sexually active: yes Last Pap: 09/2022. Results were: normal Last mammogram: 10/24. Results were: normal  Obstetric History OB History  Gravida Para Term Preterm AB Living  4 1 1  3 1   SAB IAB Ectopic Multiple Live Births  3   0 1    # Outcome Date GA Lbr Len/2nd Weight Sex Type Anes PTL Lv  4 Term 03/01/16 [redacted]w[redacted]d 11:56 / 00:37 6 lb 8.4 oz (2.96 kg) F Vag-Spont EPI  LIV  3 SAB           2 SAB           1 SAB                The following portions of the patient's history were reviewed and updated as appropriate: allergies, current medications, past family history, past medical history, past social history, past surgical history, and problem list.  Review of Systems  All other systems reviewed and are negative.   Past medical history, past surgical history, family history and social history were all reviewed and documented in the EPIC chart.  Exam:  Vitals:   07/24/23 0943  BP: 102/64  Weight: 150 lb 9.6 oz (68.3 kg)   Body mass index is 23.59 kg/m. Narrative & Impression  Indication: pelvic pain, follow up left ovarian complex cyst. Comparison made to  05/15/23 u/s   Vaginal ultrasound   Anteverted uterus Fibroids as measured on previous scan   Thin symmetrical endometrium 6.28mm No masses or thickening seen   Both ovaries normal size with normal follicle pattern and perfusion   No adnexal masses   No free fluid   Impression:fibroids, ovarian cyst  resolved      Assessment/Plan:   1. Complex cyst of left ovary (Primary) Resolved, pt reassured  2. Pelvic pain  3. Menorrhagia with regular cycle Hx of clotting disorder, not a candidate for estrogen therapy. Discussed options for managing pain and bleeding including micronized progesterone cyclically or continuously, and IUD or surgical management. Would like to meet with Dr Dallie to discuss options for surgical management.   Return for consult Dr Dallie.  GINETTE COZIER B WHNP-BC 10:40 AM 07/24/2023

## 2023-07-25 ENCOUNTER — Ambulatory Visit: Admitting: Obstetrics and Gynecology

## 2023-07-25 ENCOUNTER — Encounter: Payer: Self-pay | Admitting: Obstetrics and Gynecology

## 2023-07-25 VITALS — BP 96/64 | HR 91 | Temp 98.3°F | Ht 66.25 in | Wt 149.0 lb

## 2023-07-25 DIAGNOSIS — N920 Excessive and frequent menstruation with regular cycle: Secondary | ICD-10-CM

## 2023-07-25 DIAGNOSIS — R102 Pelvic and perineal pain: Secondary | ICD-10-CM | POA: Diagnosis not present

## 2023-07-25 DIAGNOSIS — D219 Benign neoplasm of connective and other soft tissue, unspecified: Secondary | ICD-10-CM | POA: Diagnosis not present

## 2023-07-25 NOTE — Progress Notes (Signed)
 43 y.o. [id number] female with AUB, pelvic pain, fam hx of clotting (indeterminate anti-cardiolipin Ab, as seen H/O, on ldASA) here for surgical consultation and second opinion (evaluated by Dr. Glennon 06/19/23). Married.  Patient's last menstrual period was 07/14/2023 (approximate). Period Duration (Days): 6-7 Period Pattern: Regular Menstrual Flow: Heavy Menstrual Control: Maxi pad, Tampon Dysmenorrhea: (!) Moderate  HVB and midline pelvic pressure/cramping. Pain today 4/10. Constant. Not taking any meds. Was better with POP POP started in May 2025. However she stopped June due to side effects- extreme breast swelling and tenderness and tinnitus. Hx of prior concussion with tinnitus. Declines future child bearing  Last PAP:    Component Value Date/Time   DIAGPAP  09/18/2022 1016    - Negative for intraepithelial lesion or malignancy (NILM)   DIAGPAP  09/15/2021 0915    - Negative for intraepithelial lesion or malignancy (NILM)   DIAGPAP  09/13/2020 1400    - Negative for intraepithelial lesion or malignancy (NILM)   HPVHIGH Negative 09/18/2022 1016   HPVHIGH Negative 09/13/2020 1400   ADEQPAP  09/18/2022 1016    Satisfactory for evaluation; transformation zone component PRESENT.   ADEQPAP  09/15/2021 0915    Satisfactory for evaluation; transformation zone component PRESENT.   ADEQPAP  09/13/2020 1400    Satisfactory for evaluation; transformation zone component PRESENT.   05/15/23 TVUS: Anteverted size and shape 8.13 x 6.29  x 4.99 cm 3 fibroids seen 1.3 x .76, 0.81 x 0.55, 0.90 x 0.45cm   Thin symmetrical endometrium 7.19 mm   Right ovary volume 3.588cm  WNL with normal perfusion Left ovary volume 14.383cm  21x 20mm complex avascular cyst seen transabdominally     No adnexal masses No free fluid   Impression:fibroids, complex cyst of left ovary, repeat imagine in 10 weeks  07/24/23 TVUS: Impression:fibroids, ovarian cyst resolved  04/29/23 Hgb, Plt WNL 06/19/23  Hormonal lab normal   GYN HISTORY: No sig hx  OB History  Gravida Para Term Preterm AB Living  4 1 1  3 1   SAB IAB Ectopic Multiple Live Births  3   0 1    # Outcome Date GA Lbr Len/2nd Weight Sex Type Anes PTL Lv  4 Term 03/01/16 [redacted]w[redacted]d 11:56 / 00:37 6 lb 8.4 oz (2.96 kg) F Vag-Spont EPI  LIV  3 SAB           2 SAB           1 SAB             Past Medical History:  Diagnosis Date   Anticardiolipin antibody positive    Heterozygous MTHFR mutation C677T    Vaginal Pap smear, abnormal     Past Surgical History:  Procedure Laterality Date   DILATION AND EVACUATION N/A 03/16/2016   Procedure: Ultrasound guided DILATATION AND EVACUATION;  Surgeon: Dickie Carder, MD;  Location: [facility];  Service: Gynecology;  Laterality: N/A;   NO PAST SURGERIES      Current Outpatient Medications on File Prior to Visit  Medication Sig Dispense Refill   ASPIRIN 81 PO Take 81 mg by mouth daily.     No current facility-administered medications on file prior to visit.    Allergies  Allergen Reactions   Amoxicillin     Other Reaction(s): constipation/loose stools/abdominal pain      PE Today's Vitals   07/25/23 1536  BP: 96/64  Pulse: 91  Temp: 98.3 F (36.8 C)  TempSrc: Oral  SpO2: 99%  Weight: 149  lb (67.6 kg)  Height: 5' 6.25 (1.683 m)   Body mass index is 23.87 kg/m.  Physical Exam Vitals reviewed.  Constitutional:      General: She is not in acute distress.    Appearance: Normal appearance.  HENT:     Head: Normocephalic and atraumatic.     Nose: Nose normal.  Eyes:     Extraocular Movements: Extraocular movements intact.     Conjunctiva/sclera: Conjunctivae normal.  Pulmonary:     Effort: Pulmonary effort is normal.  Musculoskeletal:        General: Normal range of motion.     Cervical back: Normal range of motion.  Neurological:     General: No focal deficit present.     Mental Status: She is alert.  Psychiatric:        Mood and Affect: Mood normal.         Behavior: Behavior normal.       Assessment and Plan:        Menorrhagia with regular cycle Assessment & Plan: Reviewed hormonal work-up and US  as noted Recommend EMB First line therapies reviewed, including hormonal therapy and tranexamic acid. Also discussed endometrial ablation, uterine artery embolization and hysterectomy. Hormonal options limited due to fam hx and indeterminate coagulopathy testing- prev evaluated by hematology RTO for EMB. Will discuss further management at that time.   Orders: -     Endometrial biopsy; Future  Pelvic pain Assessment & Plan: Continue exp management, will discuss further at next appt   Fibroids -     Endometrial biopsy; Future    Vera LULLA Pa, MD

## 2023-08-02 ENCOUNTER — Other Ambulatory Visit

## 2023-08-02 ENCOUNTER — Encounter

## 2023-08-08 ENCOUNTER — Encounter: Payer: Self-pay | Admitting: Obstetrics and Gynecology

## 2023-08-08 DIAGNOSIS — D219 Benign neoplasm of connective and other soft tissue, unspecified: Secondary | ICD-10-CM

## 2023-08-08 DIAGNOSIS — N920 Excessive and frequent menstruation with regular cycle: Secondary | ICD-10-CM | POA: Insufficient documentation

## 2023-08-08 DIAGNOSIS — R102 Pelvic and perineal pain: Secondary | ICD-10-CM | POA: Insufficient documentation

## 2023-08-08 HISTORY — DX: Benign neoplasm of connective and other soft tissue, unspecified: D21.9

## 2023-08-08 NOTE — Assessment & Plan Note (Addendum)
 Reviewed hormonal work-up and US  as noted Recommend EMB First line therapies reviewed, including hormonal therapy and tranexamic acid. Also discussed endometrial ablation, uterine artery embolization and hysterectomy. Hormonal options limited due to fam hx and indeterminate coagulopathy testing- prev evaluated by hematology RTO for EMB. Will discuss further management at that time.

## 2023-08-08 NOTE — Assessment & Plan Note (Signed)
 Continue exp management, will discuss further at next appt

## 2023-08-26 ENCOUNTER — Other Ambulatory Visit (HOSPITAL_COMMUNITY)
Admission: RE | Admit: 2023-08-26 | Discharge: 2023-08-26 | Disposition: A | Discharge status: Home / Self Care | Source: Ambulatory Visit | Attending: Obstetrics and Gynecology | Admitting: Obstetrics and Gynecology

## 2023-08-26 ENCOUNTER — Encounter: Payer: Self-pay | Admitting: Obstetrics and Gynecology

## 2023-08-26 ENCOUNTER — Ambulatory Visit: Admitting: Obstetrics and Gynecology

## 2023-08-26 VITALS — BP 96/64 | HR 63 | Temp 98.3°F | Wt 149.0 lb

## 2023-08-26 DIAGNOSIS — D219 Benign neoplasm of connective and other soft tissue, unspecified: Secondary | ICD-10-CM | POA: Diagnosis not present

## 2023-08-26 DIAGNOSIS — N920 Excessive and frequent menstruation with regular cycle: Secondary | ICD-10-CM | POA: Insufficient documentation

## 2023-08-26 DIAGNOSIS — Z01812 Encounter for preprocedural laboratory examination: Secondary | ICD-10-CM | POA: Diagnosis not present

## 2023-08-26 LAB — PREGNANCY, URINE: Preg Test, Ur: NEGATIVE

## 2023-08-26 NOTE — Assessment & Plan Note (Signed)
 Reviewed hormonal work-up and US  as noted Uncx EMB  Hormonal options limited due to fam hx and indeterminate coagulopathy testing- prev evaluated by hematology Discussed IUD vs hysterectomy  Plan for robotic assisted total laparoscopic hysterectomy and bilateral salpingectomy, cystoscopy Discussed outpatient procedure. Reviewed that  recovery is usually 6 weeks with additional 4 weeks of pelvic rest. Risks including infections, bleeding, and damage to surrounding organs reviewed.  Patient is agreeable to blood transfusion in the event of an emergency.  Patient in agreement with initial plan. Orders placed for surgery. RTO for preop visit.  Preop checklist: Meds and allergies reviewed. Antibiotics: Ancef , flagyl DVT ppx: SCDs, lovenox  Postop visit: 2, 6 week Additional clearance: none Tubal and hysterectomy papers signed: N/A

## 2023-08-26 NOTE — Progress Notes (Signed)
 43 y.o. [id number] female with AUB, pelvic pain, fam hx of clotting (indeterminate anti-cardiolipin Ab, has seen H/O, on ldASA) here for EMB. Married.  Patient's last menstrual period was 08/10/2023 (exact date).   HVB and midline pelvic pressure/cramping w/wo periods.  Pain today 4/10. Constant. Not taking any meds. Was better with POP POP started in May 2025. However she stopped June due to side effects- extreme breast swelling and tenderness and tinnitus. Hx of prior concussion with tinnitus.  Negative GI workup.  Sister had uterine cancer. She desires definitive management. Declines future child bearing  No SOB or CP.  Last PAP:    Component Value Date/Time   DIAGPAP  09/18/2022 1016    - Negative for intraepithelial lesion or malignancy (NILM)   DIAGPAP  09/15/2021 0915    - Negative for intraepithelial lesion or malignancy (NILM)   DIAGPAP  09/13/2020 1400    - Negative for intraepithelial lesion or malignancy (NILM)   HPVHIGH Negative 09/18/2022 1016   HPVHIGH Negative 09/13/2020 1400   ADEQPAP  09/18/2022 1016    Satisfactory for evaluation; transformation zone component PRESENT.   ADEQPAP  09/15/2021 0915    Satisfactory for evaluation; transformation zone component PRESENT.   ADEQPAP  09/13/2020 1400    Satisfactory for evaluation; transformation zone component PRESENT.   05/15/23 TVUS: Anteverted size and shape 8.13 x 6.29  x 4.99 cm 3 fibroids seen 1.3 x .76, 0.81 x 0.55, 0.90 x 0.45cm   Thin symmetrical endometrium 7.19 mm   Right ovary volume 3.588cm  WNL with normal perfusion Left ovary volume 14.383cm  21x 20mm complex avascular cyst seen transabdominally     No adnexal masses No free fluid   Impression:fibroids, complex cyst of left ovary, repeat imagine in 10 weeks  07/24/23 TVUS: Impression:fibroids, ovarian cyst resolved  04/29/23 Hgb, Plt WNL 06/19/23 Hormonal lab normal   GYN HISTORY: No sig hx  OB History  Gravida Para Term Preterm AB  Living  4 1 1  3 1   SAB IAB Ectopic Multiple Live Births  3   0 1    # Outcome Date GA Lbr Len/2nd Weight Sex Type Anes PTL Lv  4 Term 03/01/16 [redacted]w[redacted]d 11:56 / 00:37 6 lb 8.4 oz (2.96 kg) F Vag-Spont EPI  LIV  3 SAB           2 SAB           1 SAB             Past Medical History:  Diagnosis Date   Anticardiolipin antibody positive    Heterozygous MTHFR mutation C677T    Vaginal Pap smear, abnormal     Past Surgical History:  Procedure Laterality Date   DILATION AND EVACUATION N/A 03/16/2016   Procedure: Ultrasound guided DILATATION AND EVACUATION;  Surgeon: Dickie Carder, MD;  Location: [facility];  Service: Gynecology;  Laterality: N/A;   NO PAST SURGERIES      Current Outpatient Medications on File Prior to Visit  Medication Sig Dispense Refill   ASPIRIN 81 PO Take 81 mg by mouth daily.     No current facility-administered medications on file prior to visit.    Allergies  Allergen Reactions   Amoxicillin     Other Reaction(s): constipation/loose stools/abdominal pain      PE Today's Vitals   08/26/23 1035  BP: 96/64  Pulse: 63  Temp: 98.3 F (36.8 C)  TempSrc: Oral  SpO2: 98%  Weight: 149 lb (67.6 kg)  Body mass index is 23.87 kg/m.  Physical Exam Vitals reviewed. Exam conducted with a chaperone present.  Constitutional:      General: She is not in acute distress.    Appearance: Normal appearance.  HENT:     Head: Normocephalic and atraumatic.     Nose: Nose normal.  Eyes:     Extraocular Movements: Extraocular movements intact.     Conjunctiva/sclera: Conjunctivae normal.  Pulmonary:     Effort: Pulmonary effort is normal.  Genitourinary:    General: Normal vulva.     Exam position: Lithotomy position.     Vagina: Normal. No vaginal discharge.     Cervix: Normal. No cervical motion tenderness, discharge or lesion.     Uterus: Normal. Not enlarged and not tender.      Adnexa: Right adnexa normal and left adnexa normal.  Musculoskeletal:         General: Normal range of motion.     Cervical back: Normal range of motion.  Neurological:     General: No focal deficit present.     Mental Status: She is alert.  Psychiatric:        Mood and Affect: Mood normal.        Behavior: Behavior normal.     Procedure Consent was signed. Timeout was performed. Speculum inserted into the vagina, cervix visualized and was prepped with Betadine.  Cervical block was performed was declined. A single-toothed tenaculum was placed on the anterior lip of the cervix to stabilize it.  The 3 mm pipelle was introduced into the endometrial cavity without difficulty to a depth of 8cm, suction initiated and a moderate amount of tissue was obtained and sent to pathology.  The instruments were removed from the patient's vagina.  Minimal bleeding from the cervix was noted.  The patient tolerated the procedure well.    Assessment and Plan:        Menorrhagia with regular cycle Assessment & Plan: Reviewed hormonal work-up and US  as noted Uncx EMB  Hormonal options limited due to fam hx and indeterminate coagulopathy testing- prev evaluated by hematology Discussed IUD vs hysterectomy  Plan for robotic assisted total laparoscopic hysterectomy and bilateral salpingectomy, cystoscopy Discussed outpatient procedure. Reviewed that  recovery is usually 6 weeks with additional 4 weeks of pelvic rest. Risks including infections, bleeding, and damage to surrounding organs reviewed.  Patient is agreeable to blood transfusion in the event of an emergency.  Patient in agreement with initial plan. Orders placed for surgery. RTO for preop visit.  Preop checklist: Meds and allergies reviewed. Antibiotics: Ancef , flagyl DVT ppx: SCDs, lovenox  Postop visit: 2, 6 week Additional clearance: none Tubal and hysterectomy papers signed: N/A    Orders: -     Endometrial biopsy -     Surgical pathology -     Ambulatory Referral For Surgery Scheduling  Fibroids -      Endometrial biopsy -     Ambulatory Referral For Surgery Scheduling  Pre-procedure lab exam -     Pregnancy, urine      Vera LULLA Pa, MD

## 2023-08-26 NOTE — Patient Instructions (Signed)
 It is common to have vaginal bleeding and cramping for up to 72 hours after your biopsy. Please call our office with heavy vaginal bleeding, severe abdominal pain or fever. Avoid intercourse, tampon use, douching and baths for 7 days to decrease the risk of infection.

## 2023-08-27 ENCOUNTER — Ambulatory Visit: Payer: Self-pay | Admitting: Obstetrics and Gynecology

## 2023-08-28 LAB — SURGICAL PATHOLOGY

## 2023-09-20 ENCOUNTER — Ambulatory Visit: Payer: Managed Care, Other (non HMO) | Admitting: Radiology

## 2023-09-25 ENCOUNTER — Encounter: Payer: Self-pay | Admitting: Radiology

## 2023-09-25 ENCOUNTER — Ambulatory Visit: Admitting: Radiology

## 2023-09-25 ENCOUNTER — Ambulatory Visit (INDEPENDENT_AMBULATORY_CARE_PROVIDER_SITE_OTHER): Admitting: Radiology

## 2023-09-25 VITALS — BP 100/58 | HR 64 | Ht 67.25 in | Wt 147.0 lb

## 2023-09-25 DIAGNOSIS — Z01419 Encounter for gynecological examination (general) (routine) without abnormal findings: Secondary | ICD-10-CM | POA: Diagnosis not present

## 2023-09-25 DIAGNOSIS — Z1331 Encounter for screening for depression: Secondary | ICD-10-CM

## 2023-09-25 NOTE — Progress Notes (Signed)
 Julia Santos 1980-03-05 [id number]   History:  43 y.o. G4P1 presents for annual exam. Hyst scheduled next month with Dr Dallie.  Gynecologic History Patient's last menstrual period was 09/03/2023 (exact date). Period Cycle (Days): 28 Period Duration (Days): 7-8 Period Pattern: Regular Menstrual Flow: Heavy Menstrual Control: Thin pad, Maxi pad, Tampon Dysmenorrhea: (!) Moderate Dysmenorrhea Symptoms: Cramping Contraception/Family planning: condoms Sexually active: yes Last Pap: 2024. Results were: normal Last mammogram: 06/25/23. Results were: normal  Obstetric History OB History  Gravida Para Term Preterm AB Living  4 1 1  3 1   SAB IAB Ectopic Multiple Live Births  3   0 1    # Outcome Date GA Lbr Len/2nd Weight Sex Type Anes PTL Lv  4 Term 03/01/16 [redacted]w[redacted]d 11:56 / 00:37 6 lb 8.4 oz (2.96 kg) F Vag-Spont EPI  LIV  3 SAB           2 SAB           1 SAB                09/25/2023   10:24 AM  Depression screen PHQ 2/9  Decreased Interest 0  Down, Depressed, Hopeless 0  PHQ - 2 Score 0     The following portions of the patient's history were reviewed and updated as appropriate: allergies, current medications, past family history, past medical history, past social history, past surgical history, and problem list.  Review of Systems  All other systems reviewed and are negative.   Past medical history, past surgical history, family history and social history were all reviewed and documented in the EPIC chart.  Exam:  Vitals:   09/25/23 1022  BP: (!) 100/58  Pulse: 64  SpO2: 99%  Weight: 147 lb (66.7 kg)  Height: 5' 7.25 (1.708 m)   Body mass index is 22.85 kg/m.  Physical Exam Vitals and nursing note reviewed. Exam conducted with a chaperone present.  Constitutional:      Appearance: Normal appearance. She is normal weight.  HENT:     Head: Normocephalic and atraumatic.  Neck:     Thyroid: No thyroid mass, thyromegaly or thyroid tenderness.  Cardiovascular:      Rate and Rhythm: Regular rhythm.     Heart sounds: Normal heart sounds.  Pulmonary:     Effort: Pulmonary effort is normal.     Breath sounds: Normal breath sounds.  Chest:  Breasts:    Breasts are symmetrical.     Right: Normal. No inverted nipple, mass, nipple discharge, skin change or tenderness.     Left: Normal. No inverted nipple, mass, nipple discharge, skin change or tenderness.  Abdominal:     General: Abdomen is flat. Bowel sounds are normal.     Palpations: Abdomen is soft.  Genitourinary:    General: Normal vulva.     Vagina: Normal. No vaginal discharge, bleeding or lesions.     Cervix: Normal. No discharge or lesion.     Uterus: Normal. Not enlarged and not tender.      Adnexa: Right adnexa normal and left adnexa normal.       Right: No mass, tenderness or fullness.         Left: No mass, tenderness or fullness.    Lymphadenopathy:     Upper Body:     Right upper body: No axillary adenopathy.     Left upper body: No axillary adenopathy.  Skin:    General: Skin is warm and dry.  Neurological:  Mental Status: She is alert and oriented to person, place, and time.  Psychiatric:        Mood and Affect: Mood normal.        Thought Content: Thought content normal.        Judgment: Judgment normal.      Darice Hoit, CMA present for exam  Assessment/Plan:   1. Well woman exam with routine gynecological exam (Primary)  2. Depression screen    Return in about 1 year (around 09/24/2024) for Annual.  GINETTE SHASTA NOVAK WHNP-BC 10:47 AM 09/25/2023

## 2023-09-25 NOTE — Patient Instructions (Signed)
 Preventive Care 44-44 Years Old, Female  Preventive care refers to lifestyle choices and visits with your health care provider that can promote health and wellness. Preventive care visits are also called wellness exams.  What can I expect for my preventive care visit?  Counseling  Your health care provider may ask you questions about your:  Medical history, including:  Past medical problems.  Family medical history.  Pregnancy history.  Current health, including:  Menstrual cycle.  Method of birth control.  Emotional well-being.  Home life and relationship well-being.  Sexual activity and sexual health.  Lifestyle, including:  Alcohol, nicotine or tobacco, and drug use.  Access to firearms.  Diet, exercise, and sleep habits.  Work and work Astronomer.  Sunscreen use.  Safety issues such as seatbelt and bike helmet use.  Physical exam  Your health care provider will check your:  Height and weight. These may be used to calculate your BMI (body mass index). BMI is a measurement that tells if you are at a healthy weight.  Waist circumference. This measures the distance around your waistline. This measurement also tells if you are at a healthy weight and may help predict your risk of certain diseases, such as type 2 diabetes and high blood pressure.  Heart rate and blood pressure.  Body temperature.  Skin for abnormal spots.  What immunizations do I need?    Vaccines are usually given at various ages, according to a schedule. Your health care provider will recommend vaccines for you based on your age, medical history, and lifestyle or other factors, such as travel or where you work.  What tests do I need?  Screening  Your health care provider may recommend screening tests for certain conditions. This may include:  Lipid and cholesterol levels.  Diabetes screening. This is done by checking your blood sugar (glucose) after you have not eaten for a while (fasting).  Pelvic exam and Pap test.  Hepatitis B test.  Hepatitis C  test.  HIV (human immunodeficiency virus) test.  STI (sexually transmitted infection) testing, if you are at risk.  Lung cancer screening.  Colorectal cancer screening.  Mammogram. Talk with your health care provider about when you should start having regular mammograms. This may depend on whether you have a family history of breast cancer.  BRCA-related cancer screening. This may be done if you have a family history of breast, ovarian, tubal, or peritoneal cancers.  Bone density scan. This is done to screen for osteoporosis.  Talk with your health care provider about your test results, treatment options, and if necessary, the need for more tests.  Follow these instructions at home:  Eating and drinking    Eat a diet that includes fresh fruits and vegetables, whole grains, lean protein, and low-fat dairy products.  Take vitamin and mineral supplements as recommended by your health care provider.  Do not drink alcohol if:  Your health care provider tells you not to drink.  You are pregnant, may be pregnant, or are planning to become pregnant.  If you drink alcohol:  Limit how much you have to 0-1 drink a day.  Know how much alcohol is in your drink. In the [place] one drink equals one 12 oz bottle of beer (355 mL), one 5 oz glass of wine (148 mL), or one 1 oz glass of hard liquor (44 mL).  Lifestyle  Brush your teeth every morning and night with fluoride toothpaste. Floss one time each day.  Exercise for at least  30 minutes 5 or more days each week.  Do not use any products that contain nicotine or tobacco. These products include cigarettes, chewing tobacco, and vaping devices, such as e-cigarettes. If you need help quitting, ask your health care provider.  Do not use drugs.  If you are sexually active, practice safe sex. Use a condom or other form of protection to prevent STIs.  If you do not wish to become pregnant, use a form of birth control. If you plan to become pregnant, see your health care provider for a  prepregnancy visit.  Take aspirin only as told by your health care provider. Make sure that you understand how much to take and what form to take. Work with your health care provider to find out whether it is safe and beneficial for you to take aspirin daily.  Find healthy ways to manage stress, such as:  Meditation, yoga, or listening to music.  Journaling.  Talking to a trusted person.  Spending time with friends and family.  Minimize exposure to UV radiation to reduce your risk of skin cancer.  Safety  Always wear your seat belt while driving or riding in a vehicle.  Do not drive:  If you have been drinking alcohol. Do not ride with someone who has been drinking.  When you are tired or distracted.  While texting.  If you have been using any mind-altering substances or drugs.  Wear a helmet and other protective equipment during sports activities.  If you have firearms in your house, make sure you follow all gun safety procedures.  Seek help if you have been physically or sexually abused.  What's next?  Visit your health care provider once a year for an annual wellness visit.  Ask your health care provider how often you should have your eyes and teeth checked.  Stay up to date on all vaccines.  This information is not intended to replace advice given to you by your health care provider. Make sure you discuss any questions you have with your health care provider.  Document Revised: 06/22/2020 Document Reviewed: 06/22/2020  Elsevier Patient Education  2024 [employer].

## 2023-10-07 NOTE — H&P (View-Only) (Signed)
 43 y.o. [id number] female with AUB, pelvic pain, fam hx of clotting (indeterminate anti-cardiolipin Ab, has seen H/O, on ldASA) here for preop exam: RATLH, BS, cystoscopy. Married.  Patient's last menstrual period was 09/28/2023 (exact date).   At 07/25/23 appt, she reported: HVB and midline pelvic pressure/cramping w/wo periods.  Pain today 4/10. Constant. Not taking any meds. Was better with POP POP started in May 2025. However she stopped June due to side effects- extreme breast swelling and tenderness and tinnitus. Hx of prior concussion with tinnitus. Negative GI workup.  Sister had uterine cancer. She desires definitive management. Declines future child bearing  Today, she reports doing okay. Still experiencing midline pelvic pressure. No SOB or CP. Walks and lifts weights 5d/wk  Last PAP:    Component Value Date/Time   DIAGPAP  09/18/2022 1016    - Negative for intraepithelial lesion or malignancy (NILM)   DIAGPAP  09/15/2021 0915    - Negative for intraepithelial lesion or malignancy (NILM)   DIAGPAP  09/13/2020 1400    - Negative for intraepithelial lesion or malignancy (NILM)   HPVHIGH Negative 09/18/2022 1016   HPVHIGH Negative 09/13/2020 1400   ADEQPAP  09/18/2022 1016    Satisfactory for evaluation; transformation zone component PRESENT.   ADEQPAP  09/15/2021 0915    Satisfactory for evaluation; transformation zone component PRESENT.   ADEQPAP  09/13/2020 1400    Satisfactory for evaluation; transformation zone component PRESENT.   05/15/23 TVUS: Anteverted size and shape 8.13 x 6.29  x 4.99 cm 3 fibroids seen 1.3 x .76, 0.81 x 0.55, 0.90 x 0.45cm   Thin symmetrical endometrium 7.19 mm   Right ovary volume 3.588cm  WNL with normal perfusion Left ovary volume 14.383cm  21x 20mm complex avascular cyst seen transabdominally     No adnexal masses No free fluid   Impression:fibroids, complex cyst of left ovary, repeat imagine in 10 weeks  07/24/23 TVUS:  Impression:fibroids, ovarian cyst resolved  08/26/23 pathology: A. ENDOMETRIUM, BIOPSY:  - Secretory endometrium.  - Negative for atypia/EIN and malignancy.   04/29/23 Hgb, Plt WNL 06/19/23 Hormonal lab normal   GYN HISTORY: No sig hx  OB History  Gravida Para Term Preterm AB Living  4 1 1  3 1   SAB IAB Ectopic Multiple Live Births  3   0 1    # Outcome Date GA Lbr Len/2nd Weight Sex Type Anes PTL Lv  4 Term 03/01/16 [redacted]w[redacted]d 11:56 / 00:37 6 lb 8.4 oz (2.96 kg) F Vag-Spont EPI  LIV  3 SAB           2 SAB           1 SAB             Past Medical History:  Diagnosis Date   Anticardiolipin antibody positive    Heterozygous MTHFR mutation C677T    Vaginal Pap smear, abnormal     Past Surgical History:  Procedure Laterality Date   DILATION AND EVACUATION N/A 03/16/2016   Procedure: Ultrasound guided DILATATION AND EVACUATION;  Surgeon: Dickie Carder, MD;  Location: [facility];  Service: Gynecology;  Laterality: N/A;   NO PAST SURGERIES      Current Outpatient Medications on File Prior to Visit  Medication Sig Dispense Refill   ASPIRIN 81 PO Take 81 mg by mouth daily.     No current facility-administered medications on file prior to visit.    Allergies  Allergen Reactions   Amoxicillin     Other  Reaction(s): constipation/loose stools/abdominal pain      PE Today's Vitals   10/08/23 1024  BP: 100/60  Pulse: (!) 58  Temp: 97.7 F (36.5 C)  TempSrc: Oral  SpO2: 98%  Weight: 149 lb (67.6 kg)    Body mass index is 23.16 kg/m.  Physical Exam Vitals reviewed.  Constitutional:      General: She is not in acute distress.    Appearance: Normal appearance.  HENT:     Head: Normocephalic and atraumatic.     Nose: Nose normal.  Eyes:     Extraocular Movements: Extraocular movements intact.     Conjunctiva/sclera: Conjunctivae normal.  Cardiovascular:     Rate and Rhythm: Normal rate and regular rhythm.     Heart sounds: No murmur heard.    No friction rub.  No gallop.  Pulmonary:     Effort: Pulmonary effort is normal. No respiratory distress.     Breath sounds: Normal breath sounds. No stridor. No wheezing, rhonchi or rales.  Musculoskeletal:        General: Normal range of motion.     Cervical back: Normal range of motion.  Neurological:     General: No focal deficit present.     Mental Status: She is alert.  Psychiatric:        Mood and Affect: Mood normal.        Behavior: Behavior normal.      Assessment and Plan:        Menorrhagia with regular cycle Fibroids Pelvic pain Assessment & Plan: Robotic assisted total laparoscopic hysterectomy and bilateral salpingectomy, cystoscopy scheduled 10/25/23. Proceed as scheduled.  Discussed outpatient procedure. Reviewed that  recovery is usually 6 weeks with additional 4 weeks of pelvic rest. Risks including infections, bleeding, and damage to surrounding organs reviewed.  Patient is agreeable to blood transfusion in the event of an emergency.   Preop checklist: Meds and allergies reviewed. Antibiotics: Ancef , flagyl DVT ppx: SCDs, lovenox  Postop visit: 2, 6 week Additional clearance: none Tubal and hysterectomy papers signed: N/A  Vera LULLA Pa, MD

## 2023-10-07 NOTE — Progress Notes (Incomplete)
 43 y.o. [id number] female with AUB, pelvic pain, fam hx of clotting (indeterminate anti-cardiolipin Ab, has seen H/O, on ldASA) here for preop exam: RATLH, BS, cystoscopy. Married.  Patient's last menstrual period was 09/28/2023 (exact date).   At 07/25/23 appt, she reported: HVB and midline pelvic pressure/cramping w/wo periods.  Pain today 4/10. Constant. Not taking any meds. Was better with POP POP started in May 2025. However she stopped June due to side effects- extreme breast swelling and tenderness and tinnitus. Hx of prior concussion with tinnitus. Negative GI workup.  Sister had uterine cancer. She desires definitive management. Declines future child bearing  Today, she reports doing okay. Still experiencing midline pelvic pressure. No SOB or CP. Walks and lifts weights 5d/wk  Last PAP:    Component Value Date/Time   DIAGPAP  09/18/2022 1016    - Negative for intraepithelial lesion or malignancy (NILM)   DIAGPAP  09/15/2021 0915    - Negative for intraepithelial lesion or malignancy (NILM)   DIAGPAP  09/13/2020 1400    - Negative for intraepithelial lesion or malignancy (NILM)   HPVHIGH Negative 09/18/2022 1016   HPVHIGH Negative 09/13/2020 1400   ADEQPAP  09/18/2022 1016    Satisfactory for evaluation; transformation zone component PRESENT.   ADEQPAP  09/15/2021 0915    Satisfactory for evaluation; transformation zone component PRESENT.   ADEQPAP  09/13/2020 1400    Satisfactory for evaluation; transformation zone component PRESENT.   05/15/23 TVUS: Anteverted size and shape 8.13 x 6.29  x 4.99 cm 3 fibroids seen 1.3 x .76, 0.81 x 0.55, 0.90 x 0.45cm   Thin symmetrical endometrium 7.19 mm   Right ovary volume 3.588cm  WNL with normal perfusion Left ovary volume 14.383cm  21x 20mm complex avascular cyst seen transabdominally     No adnexal masses No free fluid   Impression:fibroids, complex cyst of left ovary, repeat imagine in 10 weeks  07/24/23 TVUS:  Impression:fibroids, ovarian cyst resolved  08/26/23 pathology: A. ENDOMETRIUM, BIOPSY:  - Secretory endometrium.  - Negative for atypia/EIN and malignancy.   04/29/23 Hgb, Plt WNL 06/19/23 Hormonal lab normal   GYN HISTORY: No sig hx  OB History  Gravida Para Term Preterm AB Living  4 1 1  3 1   SAB IAB Ectopic Multiple Live Births  3   0 1    # Outcome Date GA Lbr Len/2nd Weight Sex Type Anes PTL Lv  4 Term 03/01/16 [redacted]w[redacted]d 11:56 / 00:37 6 lb 8.4 oz (2.96 kg) F Vag-Spont EPI  LIV  3 SAB           2 SAB           1 SAB             Past Medical History:  Diagnosis Date   Anticardiolipin antibody positive    Heterozygous MTHFR mutation [id number]    Vaginal Pap smear, abnormal     Past Surgical History:  Procedure Laterality Date   DILATION AND EVACUATION N/A 03/16/2016   Procedure: Ultrasound guided DILATATION AND EVACUATION;  Surgeon: Dickie Carder, MD;  Location: [facility];  Service: Gynecology;  Laterality: N/A;   NO PAST SURGERIES      Current Outpatient Medications on File Prior to Visit  Medication Sig Dispense Refill   ASPIRIN 81 PO Take 81 mg by mouth daily.     No current facility-administered medications on file prior to visit.    Allergies  Allergen Reactions   Amoxicillin     Other  Reaction(s): constipation/loose stools/abdominal pain      PE Today's Vitals   10/08/23 1024  BP: 100/60  Pulse: (!) 58  Temp: 97.7 F (36.5 C)  TempSrc: Oral  SpO2: 98%  Weight: 149 lb (67.6 kg)    Body mass index is 23.16 kg/m.  Physical Exam Vitals reviewed.  Constitutional:      General: She is not in acute distress.    Appearance: Normal appearance.  HENT:     Head: Normocephalic and atraumatic.     Nose: Nose normal.  Eyes:     Extraocular Movements: Extraocular movements intact.     Conjunctiva/sclera: Conjunctivae normal.  Cardiovascular:     Rate and Rhythm: Normal rate and regular rhythm.     Heart sounds: No murmur heard.    No friction rub.  No gallop.  Pulmonary:     Effort: Pulmonary effort is normal. No respiratory distress.     Breath sounds: Normal breath sounds. No stridor. No wheezing, rhonchi or rales.  Musculoskeletal:        General: Normal range of motion.     Cervical back: Normal range of motion.  Neurological:     General: No focal deficit present.     Mental Status: She is alert.  Psychiatric:        Mood and Affect: Mood normal.        Behavior: Behavior normal.      Assessment and Plan:        Menorrhagia with regular cycle Fibroids Pelvic pain Assessment & Plan: Robotic assisted total laparoscopic hysterectomy and bilateral salpingectomy, cystoscopy scheduled 10/25/23. Proceed as scheduled.  Discussed outpatient procedure. Reviewed that  recovery is usually 6 weeks with additional 4 weeks of pelvic rest. Risks including infections, bleeding, and damage to surrounding organs reviewed.  Patient is agreeable to blood transfusion in the event of an emergency.   Preop checklist: Meds and allergies reviewed. Antibiotics: Ancef , flagyl DVT ppx: SCDs, lovenox  Postop visit: 2, 6 week Additional clearance: none Tubal and hysterectomy papers signed: N/A  Vera LULLA Pa, MD

## 2023-10-08 ENCOUNTER — Encounter: Payer: Self-pay | Admitting: Obstetrics and Gynecology

## 2023-10-08 ENCOUNTER — Ambulatory Visit (INDEPENDENT_AMBULATORY_CARE_PROVIDER_SITE_OTHER): Admitting: Obstetrics and Gynecology

## 2023-10-08 VITALS — BP 100/60 | HR 58 | Temp 97.7°F | Wt 149.0 lb

## 2023-10-08 DIAGNOSIS — D219 Benign neoplasm of connective and other soft tissue, unspecified: Secondary | ICD-10-CM | POA: Diagnosis not present

## 2023-10-08 DIAGNOSIS — R102 Pelvic and perineal pain unspecified side: Secondary | ICD-10-CM

## 2023-10-08 DIAGNOSIS — N920 Excessive and frequent menstruation with regular cycle: Secondary | ICD-10-CM | POA: Diagnosis not present

## 2023-10-08 MED ORDER — IBUPROFEN 800 MG PO TABS: 800.0000 mg | ORAL_TABLET | Freq: Three times a day (TID) | ORAL | 0 refills | Status: DC | PRN

## 2023-10-08 MED ORDER — OXYCODONE HCL 5 MG PO TABS: ORAL_TABLET | ORAL | 0 refills | Status: DC

## 2023-10-08 MED ORDER — ACETAMINOPHEN 500 MG PO TABS: 1000.0000 mg | ORAL_TABLET | Freq: Three times a day (TID) | ORAL | 0 refills | Status: DC | PRN

## 2023-10-08 NOTE — Addendum Note (Signed)
 Addended by: DALLIE BOLLARD V on: 10/08/2023 11:43 AM   Modules accepted: Orders

## 2023-10-08 NOTE — Patient Instructions (Signed)
 Preoperative instructions: Nothing to eat or drink after midnight, unless instructed differently regarding clear liquids by the anesthesia team at [facility]. Do not take any medications on the day of surgery, except those listed below: NONE Please follow all other instructions as provided by our surgical scheduler at [facility] and the anesthesia team at [facility].  Postoperative instructions: Clean your incision daily with mild soapy water.  Sitz bath are helpful for wound healing.   Ensure that you thoroughly dry your wound following cleaning and keep dry throughout the day.  You can apply a thin layer of Aquaphor or Vaseline over your incision. Ice packs can be applied to your incision for up to 20 minutes at a time to help with pain and swelling.

## 2023-10-08 NOTE — Assessment & Plan Note (Signed)
 Robotic assisted total laparoscopic hysterectomy and bilateral salpingectomy, cystoscopy scheduled 10/25/23  Discussed outpatient procedure. Reviewed that  recovery is usually 6 weeks with additional 4 weeks of pelvic rest. Risks including infections, bleeding, and damage to surrounding organs reviewed.  Patient is agreeable to blood transfusion in the event of an emergency.  Patient in agreement with initial plan. Orders placed for surgery. RTO for preop visit.  Preop checklist: Meds and allergies reviewed. Antibiotics: Ancef , flagyl DVT ppx: SCDs, lovenox  Postop visit: 2, 6 week Additional clearance: none Tubal and hysterectomy papers signed: N/A

## 2023-10-11 ENCOUNTER — Encounter: Payer: Self-pay | Admitting: *Deleted

## 2023-10-15 ENCOUNTER — Encounter (HOSPITAL_COMMUNITY): Payer: Self-pay | Admitting: Obstetrics and Gynecology

## 2023-10-16 ENCOUNTER — Encounter (HOSPITAL_COMMUNITY): Payer: Self-pay | Admitting: Obstetrics and Gynecology

## 2023-10-16 NOTE — Progress Notes (Addendum)
 Spoke w/ via phone for pre-op interview--- pt Lab needs dos----  upt   (per anes)    Lab results------  lab appt 10-22-2023 @ 0900 getting CBC/ T&S (per anes) COVID test -----patient states asymptomatic no test needed Arrive at ------- 0530 on 10-25-2023 NPO after MN w/ exception sips of water w/ meds Pre-Surgery Ensure or G2: n/a  Med rec completed Medications to take morning of surgery ----- none Diabetic medication ----- n/a  GLP1 agonist last dose: n/a GLP1 instructions:  Patient instructed no nail polish to be worn day of surgery Patient instructed to bring photo id and insurance card day of surgery Patient aware to have Driver (ride ) / caregiver    for 24 hours after surgery - husband, rankin sharps Patient Special Instructions ----- will pick up soap and written instructions at lab appt Pre-Op special Instructions ----- sent inbox message in epic to Dr Dallie on [phone],  requested pre-op orders Pt stated was given instructions from Dr Dallie to stop ASA 7 days prior to surgery.  Patient verbalized understanding of instructions that were given at this phone interview. Patient denies chest pain, sob, fever, cough at the interview.

## 2023-10-16 NOTE — Pre-Procedure Instructions (Signed)
 Surgical Instructions  Your procedure is scheduled on :  Friday,  10-25-2023 Report to [facility] Main Entrance A at 5:30 AM, then check in the Admitting office. Any questions or running late day of surgery :  call [phone]  Questions prior to your surgery day:  call [phone], Monday -- Friday 8am - 4pm. If you experience any cold or flu symptoms such as cough, fever, chills, shortness of breath, etc. between now and you scheduled surgery, please notify your surgeon office.   Remember: Do Not eat any food and Do Not drink any liquids after midnight the night before surgery.   This includes No water,  candy,  gum, and mints.  Take these medicines the morning of surgery with A SIPS OF WATER:  NONE   May take these medicines IF NEEDED:  NONE   One week prior to surgery, STOP taking any Aspirin (unless otherwise instructed by your surgeon) Aleve, Naproxen, ibuprofen , Motrin , Advil , Goody's, BC's, all herbal medications/ supplements, fish oil, and non-prescription vitamins.  Do NOT Smoke (tobacco/ vaping) and Do Not drink alcohol for 24 hours prior to your procedure.  For those patients that use a CPAP.  Please bring your CPAP/ mask/ tubing with them day of surgery .  You will be asked to removed any contacts, glasses, piercing's, hearing aid's, dentures/ partials prior to surgery.  Please bring cases/ container/ solution/ etc., for them day of surgery.   Patients discharged the day of surgery will NOT be allowed to drive home.  You must have responsible driver and caregiver to stay at home with you the next 24 hours.  SURGICAL WAITING ROOM VISITATION Patients may have no more than 2 support people in the waiting area - if more than 2 , these visitors may rotate.  Pre-op nurse will coordinate an appropriate time for 1 Adult support person, who may not rotate, to accompany patient in pre-op.  Aware some patients may have certain circumstances, speak to pre-op nurse day of surgery.   Children under the age 42 must have an adult with them who is not the patient and must remain in the main waiting area with an adult.  If the patient needs to stay at the hospital during part of their recovery, the visitor guidelines for inpatient rooms apply.  Please refer to the [facility] website for the visitor guidelines for any additional information.  If you received a COVID test during your pre-op visit it is requested that you wear a mask when out in public, stay away from anyone that may not be feeling well and notify your surgeon if you develop symptoms.  If you have been in contact with anyone that has tested positive in the past 10 days notify your surgeon.     [place] [facility] - Preparing for Surgery  Before surgery, you can play an important role. Because skin is not sterile, it needs to be as free of germs as possible. You can reduce the number of germs on your skin by washing with CHG (chlorhexidine gluconate) soap before surgery. CHG is an antiseptic cleaner which kills germs and bonds with the skin to continue killing germs even after washing. Oral hygiene is also important in reducing the risk of infection. Remember to brush your teeth with your regular toothpaste the morning of surgery.  Please DO NOT use if you have an allergy to CHG or antibacterial soaps. If your skin becomes reddened/irritated stop using the CHG and inform your Pre-op nurse Day of surgery.  DO NOT shave (including legs and genital area) for at least 48 hours prior to your CHG shower.   Please follow these instructions carefully:  Shower with CHG soap the night before surgery. If you choose to wash your hair, wash your hair first as usual with your normal shampoo. After you shampoo, rinse your hair and body thoroughly to remove the shampoo. Use CHG as you would any other liquid soap. You can apply CHG directly to the skin and wash gently with a clean washcloth or shower sponge. Apply the CHG soap to your  body ONLY FROM THE NECK DOWN. Do not use on open wounds or open sores. Avoid contact with your eyes, ears, mouth, and genitals (private parts). Wash genitals (private parts) with your normal soap. Wash thoroughly, paying special attention to the area where your surgery will be performed. Thoroughly rinse your body with warm water from the neck down. DO NOT shower/wash with your normal soap after using and rinsing off the CHG soap. DO NOT use lotions, oils, etc., after showering with CHG. Pat yourself dry with a clean towel. Wear clean pajamas. Place clean sheets on your bed the night of your CHG shower and do not sleep with pets.  Day of Surgery  DO NOT Apply any lotions,  powder,  oils,  deodorants (may use underarm deodorant),  cologne/  perfumes  or makeup Do Not wear jewelry /  piercing's/  metal/  permanent jewelry must be removed prior to arrival day of surgery. (No plastic piercing) Do Not wear nail polish,  gel polish,  artificial nails, or any other type of covering on natural finger nails (toe nails are okay) Remember to brush your teeth and rinse mouth out. Put on clean / comfortable clothes. [facility] is not responsible for valuables/ personal belongings

## 2023-10-21 ENCOUNTER — Telehealth: Payer: Self-pay | Admitting: *Deleted

## 2023-10-21 NOTE — Telephone Encounter (Signed)
 Call returned to patient. Patient is scheduled for [facility] on 10/25/23. Reports cold symptoms started over the weekend.   Reports nasal  congestion, post nasal drip, raw throat, coughing, fatigue, achy. Denies fever/chills, coughing up mucous, GI symptoms.   Has not taken COVID or flu test. Is willing to take OTC test or Urgent Care for further evaluation. States she has PCP, likely unable to get appt.   Advised I will review with Dr. Dallie and f/u. Patient agreeable.   Dr. Dallie -please review.

## 2023-10-21 NOTE — Telephone Encounter (Signed)
 Spoke with patient, seen by Urgent Care this morning. Covid and Flu negative. Denies Fever or SOB.   Patient will keep visit as scheduled with Pre-op at [facility] on 10/22/23, proceed as scheduled for surgery, is aware to call if symptoms get worse or new symptoms develop.   Routing to provider for final review. Patient is agreeable to disposition. Will close encounter.

## 2023-10-21 NOTE — Telephone Encounter (Signed)
 Spoke with patient, advised per Dr. Dallie. Patient will call with update after her visit, states she is going to [facility].   Update provided to [facility] Pre-op RN.

## 2023-10-22 ENCOUNTER — Encounter (HOSPITAL_COMMUNITY)
Admission: RE | Admit: 2023-10-22 | Discharge: 2023-10-22 | Disposition: A | Discharge status: Home / Self Care | Source: Ambulatory Visit | Attending: Obstetrics and Gynecology | Admitting: Obstetrics and Gynecology

## 2023-10-22 DIAGNOSIS — Z01812 Encounter for preprocedural laboratory examination: Secondary | ICD-10-CM | POA: Diagnosis present

## 2023-10-22 DIAGNOSIS — Z01818 Encounter for other preprocedural examination: Secondary | ICD-10-CM

## 2023-10-22 LAB — CBC
HCT: 39.5 % (ref 36.0–46.0)
Hemoglobin: 13.4 g/dL (ref 12.0–15.0)
MCH: 32.8 pg (ref 26.0–34.0)
MCHC: 33.9 g/dL (ref 30.0–36.0)
MCV: 96.6 fL (ref 80.0–100.0)
Platelets: 351 K/uL (ref 150–400)
RBC: 4.09 MIL/uL (ref 3.87–5.11)
RDW: 11.8 % (ref 11.5–15.5)
WBC: 4.8 K/uL (ref 4.0–10.5)
nRBC: 0 % (ref 0.0–0.2)

## 2023-10-22 LAB — TYPE AND SCREEN
ABO/RH(D): A POS
Antibody Screen: NEGATIVE

## 2023-10-23 ENCOUNTER — Encounter: Payer: Self-pay | Admitting: Obstetrics and Gynecology

## 2023-10-24 NOTE — Anesthesia Preprocedure Evaluation (Addendum)
 Anesthesia Evaluation  Patient identified by MRN, date of birth, ID band Patient awake    Reviewed: Allergy & Precautions, NPO status , Patient's Chart, lab work & pertinent test results  History of Anesthesia Complications Negative for: history of anesthetic complications  Airway Mallampati: II  TM Distance: >3 FB Neck ROM: Full   Comment: Previous grade I view with Miller 2 Dental  (+) Dental Advisory Given,    Pulmonary neg shortness of breath, neg COPD, Recent URI  (10/12: nasal congestion, cough from postnasal drip, COVID and flu negative), Resolved, former smoker   Pulmonary exam normal breath sounds clear to auscultation       Cardiovascular negative cardio ROS  Rhythm:Regular Rate:Normal     Neuro/Psych  PSYCHIATRIC DISORDERS (panic attacks) Anxiety     negative neurological ROS     GI/Hepatic negative GI ROS, Neg liver ROS,,,  Endo/Other  negative endocrine ROS    Renal/GU negative Renal ROS     Musculoskeletal   Abdominal   Peds  Hematology  (+) Blood dyscrasia (anticardiolipin antibody positive) Lab Results      Component                Value               Date                      WBC                      4.8                 10/22/2023                HGB                      13.4                10/22/2023                HCT                      39.5                10/22/2023                MCV                      96.6                10/22/2023                PLT                      351                 10/22/2023              Anesthesia Other Findings Mother has a h/o porphyria  Reproductive/Obstetrics Fibroids                               Anesthesia Physical Anesthesia Plan  ASA: 2  Anesthesia Plan: General   Post-op Pain Management: Tylenol  PO (pre-op)*   Induction: Intravenous  PONV Risk Score and Plan: 3 and Ondansetron , Dexamethasone , Midazolam , Treatment may  vary due to age or medical condition and Scopolamine   patch - Pre-op  Airway Management Planned: Oral ETT  Additional Equipment:   Intra-op Plan:   Post-operative Plan: Extubation in OR  Informed Consent: I have reviewed the patients History and Physical, chart, labs and discussed the procedure including the risks, benefits and alternatives for the proposed anesthesia with the patient or authorized representative who has indicated his/her understanding and acceptance.     Dental advisory given  Plan Discussed with: CRNA and Anesthesiologist  Anesthesia Plan Comments: (Risks of general anesthesia discussed including, but not limited to, sore throat, hoarse voice, chipped/damaged teeth, injury to vocal cords, nausea and vomiting, allergic reactions, lung infection, heart attack, stroke, and death. All questions answered. )         Anesthesia Quick Evaluation

## 2023-10-25 ENCOUNTER — Other Ambulatory Visit: Payer: Self-pay

## 2023-10-25 ENCOUNTER — Ambulatory Visit (HOSPITAL_COMMUNITY): Payer: Self-pay | Admitting: Anesthesiology

## 2023-10-25 ENCOUNTER — Observation Stay (HOSPITAL_COMMUNITY)
Admission: RE | Admit: 2023-10-25 | Discharge: 2023-10-25 | Disposition: A | Discharge status: Home / Self Care | Attending: Obstetrics and Gynecology | Admitting: Obstetrics and Gynecology

## 2023-10-25 ENCOUNTER — Encounter (HOSPITAL_COMMUNITY)
Admission: RE | Disposition: A | Discharge status: Home / Self Care | Payer: Self-pay | Source: Home / Self Care | Attending: Obstetrics and Gynecology

## 2023-10-25 ENCOUNTER — Encounter (HOSPITAL_COMMUNITY): Payer: Self-pay | Admitting: Obstetrics and Gynecology

## 2023-10-25 DIAGNOSIS — Z7982 Long term (current) use of aspirin: Secondary | ICD-10-CM | POA: Insufficient documentation

## 2023-10-25 DIAGNOSIS — D219 Benign neoplasm of connective and other soft tissue, unspecified: Secondary | ICD-10-CM | POA: Diagnosis not present

## 2023-10-25 DIAGNOSIS — N809 Endometriosis, unspecified: Secondary | ICD-10-CM | POA: Insufficient documentation

## 2023-10-25 DIAGNOSIS — Z87891 Personal history of nicotine dependence: Secondary | ICD-10-CM | POA: Diagnosis not present

## 2023-10-25 DIAGNOSIS — N920 Excessive and frequent menstruation with regular cycle: Secondary | ICD-10-CM | POA: Diagnosis not present

## 2023-10-25 DIAGNOSIS — D259 Leiomyoma of uterus, unspecified: Secondary | ICD-10-CM

## 2023-10-25 DIAGNOSIS — R102 Pelvic and perineal pain unspecified side: Secondary | ICD-10-CM | POA: Diagnosis not present

## 2023-10-25 DIAGNOSIS — Z01818 Encounter for other preprocedural examination: Principal | ICD-10-CM

## 2023-10-25 HISTORY — DX: Excessive and frequent menstruation with regular cycle: N92.0

## 2023-10-25 HISTORY — DX: Tourette's disorder: F95.2

## 2023-10-25 HISTORY — DX: Anxiety disorder, unspecified: F41.9

## 2023-10-25 HISTORY — DX: Personal history of other mental and behavioral disorders: Z86.59

## 2023-10-25 HISTORY — PX: EXCISION, ENDOMETRIOSIS, ROBOTIC ASSISTED, LAPAROSCOPIC: SHX7564

## 2023-10-25 HISTORY — DX: Leiomyoma of uterus, unspecified: D25.9

## 2023-10-25 HISTORY — DX: Pelvic and perineal pain unspecified side: R10.20

## 2023-10-25 HISTORY — PX: HYSTERECTOMY, TOTAL, LAPAROSCOPIC, ROBOT-ASSISTED WITH SALPINGECTOMY: SHX7587

## 2023-10-25 HISTORY — DX: Personal history of other diseases of the female genital tract: Z87.42

## 2023-10-25 HISTORY — DX: Other constipation: K59.09

## 2023-10-25 HISTORY — PX: CYSTOSCOPY: SHX5120

## 2023-10-25 LAB — POCT PREGNANCY, URINE: Preg Test, Ur: NEGATIVE

## 2023-10-25 SURGERY — HYSTERECTOMY, TOTAL, LAPAROSCOPIC, ROBOT-ASSISTED WITH SALPINGECTOMY
Anesthesia: General | Site: Pelvis

## 2023-10-25 MED ORDER — DEXMEDETOMIDINE HCL IN NACL 80 MCG/20ML IV SOLN
INTRAVENOUS | Status: DC | PRN
  Administered 2023-10-25: 8 ug via INTRAVENOUS

## 2023-10-25 MED ORDER — AMISULPRIDE (ANTIEMETIC) 5 MG/2ML IV SOLN: 10.0000 mg | Freq: Once | INTRAVENOUS | Status: DC | PRN

## 2023-10-25 MED ORDER — HYDROMORPHONE HCL 1 MG/ML IJ SOLN
INTRAMUSCULAR | Status: AC
  Filled 2023-10-25: qty 1

## 2023-10-25 MED ORDER — IBUPROFEN 600 MG PO TABS: 600.0000 mg | ORAL_TABLET | Freq: Four times a day (QID) | ORAL | Status: DC

## 2023-10-25 MED ORDER — ENOXAPARIN SODIUM 40 MG/0.4ML IJ SOSY
PREFILLED_SYRINGE | INTRAMUSCULAR | Status: AC
  Filled 2023-10-25: qty 0.4

## 2023-10-25 MED ORDER — BUPIVACAINE HCL (PF) 0.5 % IJ SOLN
INTRAMUSCULAR | Status: AC
  Filled 2023-10-25: qty 30

## 2023-10-25 MED ORDER — SCOPOLAMINE 1 MG/3DAYS TD PT72
1.0000 | MEDICATED_PATCH | TRANSDERMAL | Status: DC
  Administered 2023-10-25: 1 mg via TRANSDERMAL

## 2023-10-25 MED ORDER — DEXAMETHASONE SOD PHOSPHATE PF 10 MG/ML IJ SOLN
INTRAMUSCULAR | Status: DC | PRN
  Administered 2023-10-25: 10 mg via INTRAVENOUS

## 2023-10-25 MED ORDER — LIDOCAINE 2% (20 MG/ML) 5 ML SYRINGE
INTRAMUSCULAR | Status: DC | PRN
  Administered 2023-10-25: 100 mg via INTRAVENOUS

## 2023-10-25 MED ORDER — CHLORHEXIDINE GLUCONATE 0.12 % MT SOLN
15.0000 mL | Freq: Once | OROMUCOSAL | Status: AC
  Administered 2023-10-25: 15 mL via OROMUCOSAL

## 2023-10-25 MED ORDER — SUGAMMADEX SODIUM 200 MG/2ML IV SOLN
INTRAVENOUS | Status: DC | PRN
  Administered 2023-10-25: 200 mg via INTRAVENOUS

## 2023-10-25 MED ORDER — PHENYLEPHRINE 80 MCG/ML (10ML) SYRINGE FOR IV PUSH (FOR BLOOD PRESSURE SUPPORT)
PREFILLED_SYRINGE | INTRAVENOUS | Status: DC | PRN
  Administered 2023-10-25: 160 ug via INTRAVENOUS

## 2023-10-25 MED ORDER — ORAL CARE MOUTH RINSE: 15.0000 mL | Freq: Once | OROMUCOSAL | Status: AC

## 2023-10-25 MED ORDER — CEFAZOLIN SODIUM-DEXTROSE 2-4 GM/100ML-% IV SOLN
INTRAVENOUS | Status: AC
  Filled 2023-10-25: qty 100

## 2023-10-25 MED ORDER — FENTANYL CITRATE (PF) 100 MCG/2ML IJ SOLN: 25.0000 ug | INTRAMUSCULAR | Status: DC | PRN

## 2023-10-25 MED ORDER — METRONIDAZOLE 500 MG/100ML IV SOLN
INTRAVENOUS | Status: AC
  Filled 2023-10-25: qty 100

## 2023-10-25 MED ORDER — ONDANSETRON HCL 4 MG/2ML IJ SOLN: 4.0000 mg | Freq: Four times a day (QID) | INTRAMUSCULAR | Status: DC | PRN

## 2023-10-25 MED ORDER — PHENYLEPHRINE 80 MCG/ML (10ML) SYRINGE FOR IV PUSH (FOR BLOOD PRESSURE SUPPORT)
PREFILLED_SYRINGE | INTRAVENOUS | Status: AC
  Filled 2023-10-25: qty 10

## 2023-10-25 MED ORDER — ONDANSETRON HCL 4 MG PO TABS: 4.0000 mg | ORAL_TABLET | Freq: Four times a day (QID) | ORAL | Status: DC | PRN

## 2023-10-25 MED ORDER — FENTANYL CITRATE (PF) 250 MCG/5ML IJ SOLN
INTRAMUSCULAR | Status: DC | PRN
  Administered 2023-10-25 (×2): 50 ug via INTRAVENOUS

## 2023-10-25 MED ORDER — SIMETHICONE 80 MG PO CHEW
CHEWABLE_TABLET | ORAL | Status: AC
  Filled 2023-10-25: qty 1

## 2023-10-25 MED ORDER — PROPOFOL 10 MG/ML IV BOLUS
INTRAVENOUS | Status: AC
  Filled 2023-10-25: qty 20

## 2023-10-25 MED ORDER — ACETAMINOPHEN 500 MG PO TABS
1000.0000 mg | ORAL_TABLET | ORAL | Status: AC
  Administered 2023-10-25: 1000 mg via ORAL

## 2023-10-25 MED ORDER — LACTATED RINGERS IV SOLN: INTRAVENOUS | Status: DC

## 2023-10-25 MED ORDER — SCOPOLAMINE 1 MG/3DAYS TD PT72
MEDICATED_PATCH | TRANSDERMAL | Status: AC
  Filled 2023-10-25: qty 1

## 2023-10-25 MED ORDER — MIDAZOLAM HCL (PF) 2 MG/2ML IJ SOLN
INTRAMUSCULAR | Status: DC | PRN
  Administered 2023-10-25: 2 mg via INTRAVENOUS

## 2023-10-25 MED ORDER — SIMETHICONE 80 MG PO CHEW
80.0000 mg | CHEWABLE_TABLET | Freq: Four times a day (QID) | ORAL | Status: DC | PRN
  Administered 2023-10-25: 80 mg via ORAL

## 2023-10-25 MED ORDER — FENTANYL CITRATE (PF) 250 MCG/5ML IJ SOLN
INTRAMUSCULAR | Status: AC
  Filled 2023-10-25: qty 5

## 2023-10-25 MED ORDER — GABAPENTIN 300 MG PO CAPS
300.0000 mg | ORAL_CAPSULE | ORAL | Status: AC
  Administered 2023-10-25: 300 mg via ORAL

## 2023-10-25 MED ORDER — GABAPENTIN 300 MG PO CAPS
ORAL_CAPSULE | ORAL | Status: AC
  Filled 2023-10-25: qty 1

## 2023-10-25 MED ORDER — SODIUM CHLORIDE 0.9 % IR SOLN
Status: DC | PRN
  Administered 2023-10-25 (×2): 1000 mL

## 2023-10-25 MED ORDER — OXYCODONE HCL 5 MG/5ML PO SOLN: 5.0000 mg | Freq: Once | ORAL | Status: DC | PRN

## 2023-10-25 MED ORDER — EPHEDRINE SULFATE-NACL 50-0.9 MG/10ML-% IV SOSY
PREFILLED_SYRINGE | INTRAVENOUS | Status: DC | PRN
  Administered 2023-10-25: 5 mg via INTRAVENOUS

## 2023-10-25 MED ORDER — KETOROLAC TROMETHAMINE 30 MG/ML IJ SOLN
30.0000 mg | Freq: Four times a day (QID) | INTRAMUSCULAR | Status: DC
  Administered 2023-10-25: 30 mg via INTRAVENOUS
  Filled 2023-10-25: qty 1

## 2023-10-25 MED ORDER — KETOROLAC TROMETHAMINE 30 MG/ML IJ SOLN
INTRAMUSCULAR | Status: AC
  Filled 2023-10-25: qty 1

## 2023-10-25 MED ORDER — EPHEDRINE 5 MG/ML INJ
INTRAVENOUS | Status: AC
  Filled 2023-10-25: qty 5

## 2023-10-25 MED ORDER — LIDOCAINE 2% (20 MG/ML) 5 ML SYRINGE
INTRAMUSCULAR | Status: AC
  Filled 2023-10-25: qty 5

## 2023-10-25 MED ORDER — ONDANSETRON HCL 4 MG/2ML IJ SOLN
INTRAMUSCULAR | Status: DC | PRN
  Administered 2023-10-25: 4 mg via INTRAVENOUS

## 2023-10-25 MED ORDER — FLUORESCEIN SODIUM 10 % IV SOLN
INTRAVENOUS | Status: AC
  Filled 2023-10-25: qty 5

## 2023-10-25 MED ORDER — CELECOXIB 200 MG PO CAPS
ORAL_CAPSULE | ORAL | Status: AC
  Filled 2023-10-25: qty 2

## 2023-10-25 MED ORDER — CEFAZOLIN SODIUM-DEXTROSE 2-4 GM/100ML-% IV SOLN
2.0000 g | INTRAVENOUS | Status: AC
  Administered 2023-10-25: 2 g via INTRAVENOUS

## 2023-10-25 MED ORDER — METRONIDAZOLE 500 MG/100ML IV SOLN
500.0000 mg | Freq: Once | INTRAVENOUS | Status: AC
  Administered 2023-10-25: 500 mg via INTRAVENOUS

## 2023-10-25 MED ORDER — ONDANSETRON HCL 4 MG/2ML IJ SOLN
INTRAMUSCULAR | Status: AC
  Filled 2023-10-25: qty 2

## 2023-10-25 MED ORDER — ROCURONIUM BROMIDE 10 MG/ML (PF) SYRINGE
PREFILLED_SYRINGE | INTRAVENOUS | Status: AC
  Filled 2023-10-25: qty 10

## 2023-10-25 MED ORDER — ENOXAPARIN SODIUM 40 MG/0.4ML IJ SOSY
40.0000 mg | PREFILLED_SYRINGE | INTRAMUSCULAR | Status: AC
  Administered 2023-10-25: 40 mg via SUBCUTANEOUS

## 2023-10-25 MED ORDER — METHYLENE BLUE 20 MG/2ML IV SOSY
PREFILLED_SYRINGE | INTRAVENOUS | Status: AC
  Filled 2023-10-25: qty 2

## 2023-10-25 MED ORDER — OXYCODONE HCL 5 MG PO TABS: 5.0000 mg | ORAL_TABLET | Freq: Once | ORAL | Status: DC | PRN

## 2023-10-25 MED ORDER — CELECOXIB 200 MG PO CAPS
400.0000 mg | ORAL_CAPSULE | ORAL | Status: AC
  Administered 2023-10-25: 400 mg via ORAL

## 2023-10-25 MED ORDER — HYDROMORPHONE HCL 1 MG/ML IJ SOLN
0.2000 mg | INTRAMUSCULAR | Status: DC | PRN
  Administered 2023-10-25: 0.2 mg via INTRAVENOUS

## 2023-10-25 MED ORDER — BUPIVACAINE HCL (PF) 0.5 % IJ SOLN
INTRAMUSCULAR | Status: DC | PRN
  Administered 2023-10-25: 18 mL

## 2023-10-25 MED ORDER — GLYCOPYRROLATE 0.2 MG/ML IJ SOLN
INTRAMUSCULAR | Status: DC | PRN
  Administered 2023-10-25: .2 mg via INTRAVENOUS

## 2023-10-25 MED ORDER — OXYCODONE HCL 5 MG PO TABS
5.0000 mg | ORAL_TABLET | ORAL | Status: DC | PRN
  Administered 2023-10-25: 10 mg via ORAL

## 2023-10-25 MED ORDER — OXYCODONE HCL 5 MG PO TABS
ORAL_TABLET | ORAL | Status: AC
  Filled 2023-10-25: qty 2

## 2023-10-25 MED ORDER — 0.9 % SODIUM CHLORIDE (POUR BTL) OPTIME
TOPICAL | Status: DC | PRN
  Administered 2023-10-25: 1000 mL

## 2023-10-25 MED ORDER — PHENYLEPHRINE HCL-NACL 20-0.9 MG/250ML-% IV SOLN
INTRAVENOUS | Status: DC | PRN
  Administered 2023-10-25: 20 ug/min via INTRAVENOUS

## 2023-10-25 MED ADMIN — Rocuronium Bromide IV Soln Pref Syr 100 MG/10ML (10 MG/ML): 10 mg | INTRAVENOUS | NDC 99999070048

## 2023-10-25 MED ADMIN — Rocuronium Bromide IV Soln Pref Syr 100 MG/10ML (10 MG/ML): 60 mg | INTRAVENOUS | NDC 99999070048

## 2023-10-25 MED ADMIN — PROPOFOL 200 MG/20ML IV EMUL: 140 mg | INTRAVENOUS | NDC 00069020910

## 2023-10-25 MED ADMIN — Rocuronium Bromide IV Soln Pref Syr 100 MG/10ML (10 MG/ML): 20 mg | INTRAVENOUS | NDC 99999070048

## 2023-10-25 MED FILL — Midazolam HCl Inj 2 MG/2ML (Base Equivalent): INTRAMUSCULAR | Qty: 2 | Status: AC

## 2023-10-25 MED FILL — Acetaminophen Tab 500 MG: ORAL | Qty: 2 | Status: AC

## 2023-10-25 MED FILL — Chlorhexidine Gluconate Soln 0.12%: OROMUCOSAL | Qty: 15 | Status: AC

## 2023-10-25 SURGICAL SUPPLY — 50 items
CHLORAPREP W/TINT 26 (MISCELLANEOUS) ×3 IMPLANT
COVER BACK TABLE 60X90IN (DRAPES) ×3 IMPLANT
COVER MAYO STAND STRL (DRAPES) ×3 IMPLANT
COVER TIP SHEARS 8 DVNC (MISCELLANEOUS) ×3 IMPLANT
DEFOGGER SCOPE WARM SEASHARP (MISCELLANEOUS) ×3 IMPLANT
DERMABOND ADVANCED .7 DNX12 (GAUZE/BANDAGES/DRESSINGS) ×3 IMPLANT
DRAPE ARM DVNC X/XI (DISPOSABLE) ×12 IMPLANT
DRAPE COLUMN DVNC XI (DISPOSABLE) ×3 IMPLANT
DRAPE SURG IRRIG POUCH 19X23 (DRAPES) ×3 IMPLANT
DRAPE UTILITY XL STRL (DRAPES) ×3 IMPLANT
DRIVER NDL MEGA SUTCUT DVNCXI (INSTRUMENTS) IMPLANT
DRIVER NDLE MEGA SUTCUT DVNCXI (INSTRUMENTS) IMPLANT
DRSG TELFA 3X8 NADH STRL (GAUZE/BANDAGES/DRESSINGS) IMPLANT
ELECTRODE REM PT RTRN 9FT ADLT (ELECTROSURGICAL) ×3 IMPLANT
FORCEPS PROGRASP DVNC XI (FORCEP) IMPLANT
GLOVE BIO SURGEON STRL SZ7 (GLOVE) ×9 IMPLANT
GLOVE BIOGEL PI IND STRL 7.0 (GLOVE) ×9 IMPLANT
GLOVE BIOGEL PI IND STRL 7.5 (GLOVE) IMPLANT
GLOVE NEODERM STER SZ 7 (GLOVE) IMPLANT
GLOVE NEODERM STRL 7.5 LF PF (GLOVE) IMPLANT
GLOVE SURG SS PI 7.0 STRL IVOR (GLOVE) IMPLANT
GLOVE SURG SS PI 7.5 STRL IVOR (GLOVE) IMPLANT
GOWN STRL REUS W/ TWL XL LVL3 (GOWN DISPOSABLE) ×9 IMPLANT
GOWN STRL SURGICAL XL XLNG (GOWN DISPOSABLE) IMPLANT
HIBICLENS CHG 4% 4OZ BTL (MISCELLANEOUS) ×6 IMPLANT
IRRIGATION SUCT STRKRFLW 2 WTP (MISCELLANEOUS) ×3 IMPLANT
KIT PINK PAD W/HEAD ARM REST (MISCELLANEOUS) ×3 IMPLANT
KIT TURNOVER KIT B (KITS) ×3 IMPLANT
LEGGING LITHOTOMY PAIR STRL (DRAPES) ×3 IMPLANT
MANIFOLD NEPTUNE II (INSTRUMENTS) ×3 IMPLANT
OBTURATOR OPTICALSTD 8 DVNC (TROCAR) ×3 IMPLANT
OCCLUDER COLPOPNEUMO (BALLOONS) IMPLANT
PACK ROBOT WH (CUSTOM PROCEDURE TRAY) ×3 IMPLANT
PAD OB MATERNITY 11 LF (PERSONAL CARE ITEMS) ×3 IMPLANT
SCISSORS MNPLR CVD DVNC XI (INSTRUMENTS) IMPLANT
SEAL UNIV 5-12 XI (MISCELLANEOUS) ×12 IMPLANT
SEALER VESSEL EXT DVNC XI (MISCELLANEOUS) IMPLANT
SET IRRIG Y-TYPE CYSTO (SET/KITS/TRAYS/PACK) IMPLANT
SET TUBE SMOKE EVAC HIGH FLOW (TUBING) ×3 IMPLANT
SOLN 0.9% NACL 1000 ML (IV SOLUTION) ×3 IMPLANT
SOLN 0.9% NACL POUR BTL 1000ML (IV SOLUTION) ×3 IMPLANT
SPIKE FLUID TRANSFER (MISCELLANEOUS) ×3 IMPLANT
SUT MNCRL AB 3-0 PS2 18 (SUTURE) ×3 IMPLANT
SUT VIC AB 0 CT1 27XBRD ANBCTR (SUTURE) IMPLANT
SUT VLOC 180 0 9IN GS21 (SUTURE) ×6 IMPLANT
TIP UTERINE 6.7X8CM BLUE DISP (MISCELLANEOUS) IMPLANT
TOWEL GREEN STERILE (TOWEL DISPOSABLE) ×3 IMPLANT
TRAY FOLEY W/BAG SLVR 14FR (SET/KITS/TRAYS/PACK) ×3 IMPLANT
TROCAR Z THREAD OPTICAL 12X100 (TROCAR) ×3 IMPLANT
UNDERPAD 30X36 HEAVY ABSORB (UNDERPADS AND DIAPERS) ×3 IMPLANT

## 2023-10-25 NOTE — Plan of Care (Signed)
  Problem: Education: Goal: Knowledge of the prescribed therapeutic regimen will improve Outcome: Completed/Met Goal: Understanding of sexual limitations or changes related to disease process or condition will improve Outcome: Completed/Met Goal: Individualized Educational Video(s) Outcome: Completed/Met   Problem: Self-Concept: Goal: Communication of feelings regarding changes in body function or appearance will improve Outcome: Completed/Met   Problem: Skin Integrity: Goal: Demonstration of wound healing without infection will improve Outcome: Completed/Met   Problem: Education: Goal: Knowledge of General Education information will improve Description: Including pain rating scale, medication(s)/side effects and non-pharmacologic comfort measures Outcome: Completed/Met   Problem: Health Behavior/Discharge Planning: Goal: Ability to manage health-related needs will improve Outcome: Completed/Met   Problem: Clinical Measurements: Goal: Ability to maintain clinical measurements within normal limits will improve Outcome: Completed/Met Goal: Will remain free from infection Outcome: Completed/Met Goal: Diagnostic test results will improve Outcome: Completed/Met Goal: Respiratory complications will improve Outcome: Completed/Met Goal: Cardiovascular complication will be avoided Outcome: Completed/Met   Problem: Activity: Goal: Risk for activity intolerance will decrease Outcome: Completed/Met   Problem: Nutrition: Goal: Adequate nutrition will be maintained Outcome: Completed/Met   Problem: Coping: Goal: Level of anxiety will decrease Outcome: Completed/Met   Problem: Elimination: Goal: Will not experience complications related to bowel motility Outcome: Completed/Met Goal: Will not experience complications related to urinary retention Outcome: Completed/Met   Problem: Pain Managment: Goal: General experience of comfort will improve and/or be controlled Outcome:  Completed/Met   Problem: Safety: Goal: Ability to remain free from injury will improve Outcome: Completed/Met   Problem: Skin Integrity: Goal: Risk for impaired skin integrity will decrease Outcome: Completed/Met

## 2023-10-25 NOTE — Interval H&P Note (Signed)
 History and Physical Interval Note:  10/25/2023 7:11 AM  Julia Santos  has presented today for surgery, with the diagnosis of Menorrhagia with regular cycle, fibroids.  The various methods of treatment have been discussed with the patient and family. After consideration of risks, benefits and other options for treatment, the patient has consented to  Procedure(s): HYSTERECTOMY, TOTAL, LAPAROSCOPIC, ROBOT-ASSISTED WITH SALPINGECTOMY (Bilateral) CYSTOSCOPY (N/A) as a surgical intervention.  The patient's history has been reviewed, patient examined, no change in status, stable for surgery.  I have reviewed the patient's chart and labs.  Questions were answered to the patient's satisfaction.     Naisha Wisdom LULLA Pa

## 2023-10-25 NOTE — Anesthesia Procedure Notes (Addendum)
 Procedure Name: Intubation Date/Time: 10/25/2023 7:41 AM  Performed by: Elby Raelene SAUNDERS, CRNAPre-anesthesia Checklist: Patient identified, Emergency Drugs available, Suction available and Patient being monitored Patient Re-evaluated:Patient Re-evaluated prior to induction Oxygen Delivery Method: Circle System Utilized Preoxygenation: Pre-oxygenation with 100% oxygen Induction Type: IV induction Ventilation: Mask ventilation without difficulty Laryngoscope Size: Miller and 2 Grade View: Grade I Tube type: Oral Tube size: 7.0 mm Number of attempts: 1 Airway Equipment and Method: Stylet and Bite block Placement Confirmation: ETT inserted through vocal cords under direct vision, positive ETCO2 and breath sounds checked- equal and bilateral Secured at: 21 cm Tube secured with: Tape Dental Injury: Teeth and Oropharynx as per pre-operative assessment

## 2023-10-25 NOTE — Anesthesia Postprocedure Evaluation (Signed)
 Anesthesia Post Note  Patient: Julia Santos  Procedure(s) Performed: HYSTERECTOMY, TOTAL, LAPAROSCOPIC, ROBOT-ASSISTED WITH SALPINGECTOMY (Bilateral: Pelvis) CYSTOSCOPY (Bladder) EXCISION, ENDOMETRIOSIS, ROBOTIC ASSISTED, LAPAROSCOPIC (Pelvis)     Patient location during evaluation: PACU Anesthesia Type: General Level of consciousness: awake Pain management: pain level controlled Vital Signs Assessment: post-procedure vital signs reviewed and stable Respiratory status: spontaneous breathing, nonlabored ventilation and respiratory function stable Cardiovascular status: blood pressure returned to baseline and stable Postop Assessment: no apparent nausea or vomiting Anesthetic complications: no   No notable events documented.  Last Vitals:  Vitals:   10/25/23 1015 10/25/23 1125  BP: 109/64 108/74  Pulse: 75 63  Resp: 18 (!) 22  Temp: (!) 36.1 C 36.6 C  SpO2: 100% 100%    Last Pain:  Vitals:   10/25/23 1125  TempSrc:   PainSc: 3                  Delon Aisha Arch

## 2023-10-25 NOTE — Transfer of Care (Signed)
 Immediate Anesthesia Transfer of Care Note  Patient: Julia Santos  Procedure(s) Performed: HYSTERECTOMY, TOTAL, LAPAROSCOPIC, ROBOT-ASSISTED WITH SALPINGECTOMY (Bilateral: Pelvis) CYSTOSCOPY (Bladder) EXCISION, ENDOMETRIOSIS, ROBOTIC ASSISTED, LAPAROSCOPIC (Pelvis)  Patient Location: PACU  Anesthesia Type:General  Level of Consciousness: awake, alert , and oriented  Airway & Oxygen Therapy: Patient Spontanous Breathing  Post-op Assessment: Report given to RN and Post -op Vital signs reviewed and stable  Post vital signs: Reviewed and stable  Last Vitals:  Vitals Value Taken Time  BP    Temp    Pulse 78 10/25/23 10:22  Resp 19 10/25/23 10:22  SpO2 100 % 10/25/23 10:22  Vitals shown include unfiled device data.  Last Pain:  Vitals:   10/25/23 0606  TempSrc: Oral  PainSc: 4       Patients Stated Pain Goal: 4 (10/25/23 0606)  Complications: No notable events documented.

## 2023-10-25 NOTE — Op Note (Signed)
 10/25/23  [id number] Julia Santos   OPERATIVE REPORT   Preop Diagnosis: Menorrhagia with regular cycle, fibroids Postop Diagnosis: same + endometriosis Procedure: Procedure(s): HYSTERECTOMY, TOTAL, LAPAROSCOPIC, ROBOT-ASSISTED WITH SALPINGECTOMY CYSTOSCOPY EXCISION, ENDOMETRIOSIS, ROBOTIC ASSISTED, LAPAROSCOPIC   Surgeon: Vera LULLA Pa, MD  Assistant: Circulator: [place], Hadassah Buel RAMAN, RN Relief Circulator: Waddell Silvano HERO, RN Relief Scrub: Janell, Marina  CINDI Waddell Silvano HERO, RN Scrub Person: Key, Jackolyn VIRGIN Neysa Joesph JAYSON Circulator Assistant: Waddell Silvano HERO, RN RN First Assistant: Jackquline Judyann CROME, RN   Fluids: please see anesthesia report   Complications: None Anesthesia: General    Findings:  8cm uterus, normal ovaries, bilateral fallopian tubes with paratubal cyst, endometriosis noted along the posterior cul-de-sac and anterior cul-de-sac.  Isolated area also noted along the left IP ligament. Cystoscopy at the end of the case with normal bladder and patent ureters bilaterally.  Bilateral jets noted.   Estimated blood loss: Minimal <25cc   Specimens:  ID Type Source Tests Collected by Time Destination  1 : cervix, uterus (endometriosis implants), bilateral fallopian tubes, paratubal cysts Tissue PATH Gyn benign resection SURGICAL PATHOLOGY Pa Vera LULLA, MD 10/25/2023 [phone]   2 : posterior peritoneum endometriosis Tissue PATH Gyn benign resection SURGICAL PATHOLOGY Pa Vera LULLA, MD 10/25/2023 0940      Disposition of specimen: Pathology   Patient is taken to the operating room. She is placed in the supine position. She is a running IV in place. Informed consent was present on the chart. SCDs on her lower extremities and functioning properly. General endotracheal anesthesia was administered by the anesthesia staff without difficulty. Her legs were placed in the low lithotomy position in Wilmot stirrups. Her arms were tucked by the side.     Chloraprep was then  used to prep the abdomen and Hibiclens was used to prep the inner thighs, perineum and vagina. Once 3 minutes had past the patient was draped in a normal standard fashion. A proper time out was performed and everyone agreed.  The legs were lifted to the high lithotomy position. A bivalve speculum was inserted into the vagina and the anterior lip of the cervix was grasped with single-tooth tenaculum.  The uterus sounded to 8 cm. Pratt dilators were used to dilate the cervix to 25fr.  The RUMI uterine manipulator was obtained inserted into the endometrial cavity and the bulb of the disposable tip was inflated with 10 cc of normal saline. There was a good fit of the KOH ring around the cervix. The tenaculum and bivavle speculum was removed. There is also good manipulation of the uterus.  A Foley catheter was placed to straight drain.  Clear urine was noted. Legs were lowered to the low lithotomy position and attention was turned the abdomen.   Superior to the umbilicus, 0.5% marcaine  used to anesthetize the skin.  Using #11 blade, 8mm skin incision was made.  The 8mm robotic trocar and sleeve was inserted under direct visualization.  CO2 gas was started and patient was placed in trendelenburg position.  Two additional 8mm ports were placed under direct visualization in the left and right lower quadrant.   A fourth port was placed in the RLQ.    Ureters were identified.  Attention was turned to the right side.  The right fallopian tube was cauterized and transected along the mesosalpinx.  Attention was turned to the right round ligament.  Right round ligament was serially clamped cauterized and incised.  The utero-ovarian ligament was then cauterized and ligated. The anterior  and posterior peritoneum of the inferior leaf of the broad ligament were opened. The beginning of the bladder flap was created.  The bladder was taken down below the level of the KOH ring. The right uterine artery skeletonized and then just  superior to the KOH ring this vessel was serially clamped, cauterized, and incised.   The same procedure was completed on the left to transect the left fallopian tube, left utero-ovarian ligaments. The anterior and posterior peritoneum of the inferior leaf of the broad ligament were opened. The remainder of the bladder flap was created using sharp dissection. The bladder was well below the level of the KOH ring. The left uterine artery skeletonized. Then the left uterine artery, above the level of the KOH ring, was serially clamped cauterized and incised. The uterus was devascularized at this point.   The colpotomy was performed.  This was carried around a circumferential fashion until the vaginal mucosa was completely incised in the specimen was freed.  The specimen was then delivered to the vagina.  A vaginal occlusive device was used to maintain the pneumoperitoneum   Instruments were changed with a needle driver and prograsp.  Using a 9 inch 0 V-lock suture, the right angle of the cuff was closed by incorporating the anterior and posterior vaginal mucosa in each stitch. Two stitches were brought back towards the midline and the suture was cut flush with the vagina. The needle was brought out the pelvis. A second suture was used to close the left angle of the cuff in a running fashion and then the peritoneum. Two stitches were brought back towards the midline and the suture was cut flush with the vagina. The needle was brought out the pelvis.   Two endometriosis implants were excised from the posterior cul-de-sac.  Given location of an additional endometriosis implant along the left IP ligament, the decision was made to not removed this implant.  The pelvis was irrigated. All pedicles were inspected. No bleeding was noted.   CO2 pressures were lowered to 8mm Hg.  Again, no bleeding was noted.  At this point the procedure was completed.  The remaining instruments were removed.  The ports were removed  under direct visualization of the laparoscope and the pneumoperitoneum was relieved.   The Foley catheter was removed.  Cystoscopy was performed.  No sutures or bladder injuries were noted.  Ureters were noted with normal urine jets from each one was seen.  Cystoscope was removed. Attention was then turned the vagina and the cuff was inspected.   The skin was then closed with subcuticular stitches of 3-0 Vicryl. The skin was cleansed. Dermabond was applied.  No bleeding was noted.  Sponge, lap, needle, instrument counts were correct x2. Patient tolerated the procedure very well. She was awakened from anesthesia, extubated and taken to recovery in stable condition.    Vera LULLA Pa, MD

## 2023-10-26 ENCOUNTER — Encounter (HOSPITAL_COMMUNITY): Payer: Self-pay | Admitting: Obstetrics and Gynecology

## 2023-10-29 LAB — SURGICAL PATHOLOGY

## 2023-10-30 ENCOUNTER — Ambulatory Visit: Payer: Self-pay | Admitting: Obstetrics and Gynecology

## 2023-11-05 ENCOUNTER — Telehealth: Payer: Self-pay

## 2023-11-05 NOTE — Telephone Encounter (Signed)
 Office visit with me tomorrow at 2:00 pm.   She can take Advil  for pain.    She should have decreased activity    If her bleeding or pain increases, have her go to the [facility] for evaluation.

## 2023-11-05 NOTE — Telephone Encounter (Signed)
 Pt called in stating that she is now experiencing some bleeding. Not enough to get on a pad mainly when she wipes. She states it just started today. Pt has a total hysterectomy on 10/25/23 by Dr. Dallie. Denies fever, chills, no falls, no heavy lifting, and no intercourse. She states that she is having a sharp pain in the middle of the panty line area that comes and goes. It has been doing this for about the last hour and a half.   She isn't having any burning with urination.   Please advise on what pt needs to do.

## 2023-11-05 NOTE — Telephone Encounter (Signed)
 Spoke with patient. In addition to previous conversation with Geni. Patient denies heavy bleeding. States she notices blood when wiping and thick globby clots, started around 1pm today. Clots are smaller than a pea. Some odor present, denies foul odor. Restarted Aspirin a few days after surgery as instructed per Dr. Dallie. No straining with bowel movements, last BM 11/04/23. Incisions are closed; denies drainage, redness or swelling. Pain currently 4 out of 10 mid lower abdomen. Abdomen is soft. Denies strenuous activity. Denies N/V.   Reviewed recommendations per Dr. Nikki. ER precautions reviewed. Advised on how to reach on call provider after hours as well if any concerns. Patient will keep OV as scheduled for 10/29.   Patient is agreeable to plan.   Routing to provider for final review. Patient is agreeable to disposition. Will close encounter.

## 2023-11-06 ENCOUNTER — Ambulatory Visit (INDEPENDENT_AMBULATORY_CARE_PROVIDER_SITE_OTHER): Payer: Self-pay | Admitting: Obstetrics and Gynecology

## 2023-11-06 ENCOUNTER — Encounter: Payer: Self-pay | Admitting: Obstetrics and Gynecology

## 2023-11-06 VITALS — BP 104/60 | HR 66 | Temp 98.0°F | Ht 67.25 in | Wt 146.0 lb

## 2023-11-06 DIAGNOSIS — N939 Abnormal uterine and vaginal bleeding, unspecified: Secondary | ICD-10-CM

## 2023-11-06 DIAGNOSIS — Z90710 Acquired absence of both cervix and uterus: Secondary | ICD-10-CM

## 2023-11-06 LAB — CBC WITH DIFFERENTIAL/PLATELET
Absolute Lymphocytes: 2203 {cells}/uL (ref 850–3900)
Absolute Monocytes: 403 {cells}/uL (ref 200–950)
Basophils Absolute: 50 {cells}/uL (ref 0–200)
Basophils Relative: 0.7 %
Eosinophils Absolute: 180 {cells}/uL (ref 15–500)
Eosinophils Relative: 2.5 %
HCT: 39.6 % (ref 35.0–45.0)
Hemoglobin: 13.6 g/dL (ref 11.7–15.5)
MCH: 33.6 pg — ABNORMAL HIGH (ref 27.0–33.0)
MCHC: 34.3 g/dL (ref 32.0–36.0)
MCV: 97.8 fL (ref 80.0–100.0)
MPV: 9.8 fL (ref 7.5–12.5)
Monocytes Relative: 5.6 %
Neutro Abs: 4363 {cells}/uL (ref 1500–7800)
Neutrophils Relative %: 60.6 %
Platelets: 458 Thousand/uL — ABNORMAL HIGH (ref 140–400)
RBC: 4.05 Million/uL (ref 3.80–5.10)
RDW: 11.4 % (ref 11.0–15.0)
Total Lymphocyte: 30.6 %
WBC: 7.2 Thousand/uL (ref 3.8–10.8)

## 2023-11-06 MED ORDER — CIPROFLOXACIN HCL 500 MG PO TABS: 500.0000 mg | ORAL_TABLET | Freq: Two times a day (BID) | ORAL | 0 refills | Status: DC

## 2023-11-06 MED ORDER — METRONIDAZOLE 500 MG PO TABS: 500.0000 mg | ORAL_TABLET | Freq: Two times a day (BID) | ORAL | 0 refills | Status: DC
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
## 2023-11-07 ENCOUNTER — Ambulatory Visit: Payer: Self-pay | Admitting: Obstetrics and Gynecology

## 2023-11-11 ENCOUNTER — Telehealth: Payer: Self-pay

## 2023-11-11 NOTE — Telephone Encounter (Signed)
 Ok to continue antibiotics.  Keep appointment with Dr. Dallie tomorrow, who can look at her hands then.

## 2023-11-11 NOTE — Telephone Encounter (Signed)
 Pt saw you on 10/29, she states that on Saturday she noticed that her veins on the backs of both of her hands were bugled out. She states that she her hands look like the hands of an 43 year old women with varicose veins. Pt denies swelling, redness, itching, decoloration, fever, chills, numbness, or pain. She states that it comes and goes. No complaints of shortness of breath.   She didn't know if this could be being causes by the Cipro or the flagyl. She states that is the only new thing she has been doing.   She did say she has an appt with Dr. Dallie tomorrow on 11/12/23, she just didn't know if she needs to stop the antibiotic.  Please advise

## 2023-11-11 NOTE — Telephone Encounter (Signed)
 Agreed okay to continue abx until tomorrow, unless she develops any pain. If so, she should stop both of them.  I will assess her further tomorrow.

## 2023-11-11 NOTE — Telephone Encounter (Signed)
 Spoke with patient, advised per Dr. Kennith Center.   Patient verbalizes understanding and is agreeable.   Encounter closed.

## 2023-11-11 NOTE — Discharge Summary (Signed)
 Physician Discharge Summary   Patient ID: Julia Santos [id number] 43 y.o. 25-Aug-1980  Admit date: 10/25/2023  Discharge date and time: 10/25/2023  5:22 PM   Admitting Physician: Vera Dallie GAILS, MD   Discharge Physician: Vera GAILS Dallie, MD  Admission Diagnoses: Menorrhagia with regular cycle [N92.0] Fibroids [D21.9] Pelvic pain [R10.20]  Discharge Diagnoses: same  Admission Condition: good  Discharged Condition: stable  Indication for Admission: Scheduled surgery  Hospital Course:  Julia Santos is a 43 y.o. female who presented for scheduled Procedure(s): HYSTERECTOMY, TOTAL, LAPAROSCOPIC, ROBOT-ASSISTED WITH SALPINGECTOMY, CYSTOSCOPY, EXCISION, ENDOMETRIOSIS, ROBOTIC ASSISTED, LAPAROSCOPIC.  Her procedure was uncomplicated.  Her pain was well-controlled, and she was ambulating without assistance.  She was voiding spontaneously.  She was tolerating a regular diet.  She met criteria for discharge on POD#0. Discharge instructions were reviewed with patient prior to discharge.  Consults: None   Disposition: Discharge disposition: 01-Home or Self Care      Patient Instructions:  Allergies as of 10/25/2023       Reactions   Amoxicillin Other (See Comments)   Other Reaction(s): constipation/loose stools/abdominal pain        Medication List     TAKE these medications    acetaminophen  500 MG tablet Commonly known as: TYLENOL  Take 2 tablets (1,000 mg total) by mouth every 8 (eight) hours as needed.   aspirin EC 81 MG tablet Take 81 mg by mouth daily. Swallow whole.   ibuprofen  800 MG tablet Commonly known as: ADVIL  Take 1 tablet (800 mg total) by mouth every 8 (eight) hours as needed for up to 42 doses.       Follow-up with me in 2 weeks.  SignedBETHA Vera GAILS Dallie 11/11/2023 8:45 AM

## 2023-11-12 ENCOUNTER — Ambulatory Visit (INDEPENDENT_AMBULATORY_CARE_PROVIDER_SITE_OTHER): Admitting: Obstetrics and Gynecology

## 2023-11-12 ENCOUNTER — Encounter: Payer: Self-pay | Admitting: Obstetrics and Gynecology

## 2023-11-12 VITALS — BP 118/62 | HR 87 | Temp 98.3°F | Ht 66.25 in | Wt 150.0 lb

## 2023-11-12 DIAGNOSIS — N809 Endometriosis, unspecified: Secondary | ICD-10-CM

## 2023-11-12 DIAGNOSIS — Z90710 Acquired absence of both cervix and uterus: Secondary | ICD-10-CM

## 2023-11-12 DIAGNOSIS — Z09 Encounter for follow-up examination after completed treatment for conditions other than malignant neoplasm: Secondary | ICD-10-CM
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
## 2023-11-26 ENCOUNTER — Other Ambulatory Visit

## 2023-11-26 ENCOUNTER — Telehealth: Payer: Self-pay

## 2023-11-26 DIAGNOSIS — R3989 Other symptoms and signs involving the genitourinary system: Secondary | ICD-10-CM

## 2023-11-26 MED ORDER — FLUCONAZOLE 150 MG PO TABS
150.0000 mg | ORAL_TABLET | Freq: Once | ORAL | 0 refills | Status: AC
Start: 2023-11-26 — End: 2023-11-26

## 2023-11-26 NOTE — Telephone Encounter (Signed)
 Patient called and stated that she is having some vaginal itching,burning & white watery discharge. She is also having pain near her urethra for several days. Discomfort is not intense but it is not getting better. She said she is 4 weeks out from a hysterectomy and wanted to know what she should do for her symptoms.

## 2023-11-26 NOTE — Telephone Encounter (Signed)
 Patient notified of recommedations. Appt scheduled for Thursday at 8:30 for office visit. Patient aware to arrive at 8:15 for check in. Diflucan 150mg  sent to pharmacy #1 with 0 refills. Lab appt scheduled today at 1:30 for urine culture only.

## 2023-11-27 ENCOUNTER — Ambulatory Visit: Admitting: Obstetrics and Gynecology

## 2023-11-28 ENCOUNTER — Encounter: Payer: Self-pay | Admitting: Obstetrics and Gynecology

## 2023-11-28 ENCOUNTER — Ambulatory Visit: Admitting: Obstetrics and Gynecology

## 2023-11-28 VITALS — BP 118/62 | HR 100 | Temp 98.1°F | Wt 148.0 lb

## 2023-11-28 DIAGNOSIS — N898 Other specified noninflammatory disorders of vagina: Secondary | ICD-10-CM

## 2023-11-28 DIAGNOSIS — R102 Pelvic and perineal pain unspecified side: Secondary | ICD-10-CM

## 2023-11-28 DIAGNOSIS — K5900 Constipation, unspecified: Secondary | ICD-10-CM | POA: Diagnosis not present

## 2023-11-28 DIAGNOSIS — R3989 Other symptoms and signs involving the genitourinary system: Secondary | ICD-10-CM

## 2023-11-28 DIAGNOSIS — B3731 Acute candidiasis of vulva and vagina: Secondary | ICD-10-CM | POA: Diagnosis not present

## 2023-11-28 DIAGNOSIS — K644 Residual hemorrhoidal skin tags: Secondary | ICD-10-CM | POA: Diagnosis not present

## 2023-11-28 LAB — URINE CULTURE
MICRO NUMBER:: 17250883
SPECIMEN QUALITY:: ADEQUATE

## 2023-11-28 LAB — WET PREP FOR TRICH, YEAST, CLUE

## 2023-11-28 MED ORDER — FLUCONAZOLE 150 MG PO TABS: 150.0000 mg | ORAL_TABLET | Freq: Once | ORAL | 0 refills | Status: AC
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
## 2023-11-29 ENCOUNTER — Telehealth: Payer: Self-pay | Admitting: *Deleted

## 2023-11-29 NOTE — Telephone Encounter (Signed)
 Spoke with patient. Patient was seen 11/28/23, states she was advised to return call if urethral pain gets worse or returns. Asking when culture results will be back?   Advised urine culture results take 3 days/72 hrs. Our office will notify once they are back and reviewed by Dr. Dallie.  Patient denies any new symptoms. Denies fever/chills, flank pain. States she started OTC cranberry supplement and took diflucan  as ordered. States yeast has improved.   Instructed to hydrate well. Advised I will provide update to Dr. Dallie, our office will f/u with any additional recommendations. ER/Urgent care for new/worsening symptoms.  Advised patient Dr. Dallie is out of the office, response may not be immediate. Patient agreeable.   Routing to Dr. Dallie

## 2023-11-30 LAB — URINALYSIS, COMPLETE W/RFL CULTURE
Bilirubin Urine: NEGATIVE
Glucose, UA: NEGATIVE
Hgb urine dipstick: NEGATIVE
Hyaline Cast: NONE SEEN /LPF
Ketones, ur: NEGATIVE
Nitrites, Initial: NEGATIVE
Protein, ur: NEGATIVE
RBC / HPF: NONE SEEN /HPF (ref 0–2)
Specific Gravity, Urine: 1.01 (ref 1.001–1.035)
pH: 6 (ref 5.0–8.0)

## 2023-11-30 LAB — CULTURE INDICATED

## 2023-11-30 LAB — URINE CULTURE
MICRO NUMBER:: 17262266
SPECIMEN QUALITY:: ADEQUATE

## 2023-12-02 ENCOUNTER — Ambulatory Visit: Payer: Self-pay | Admitting: Obstetrics and Gynecology

## 2023-12-02 DIAGNOSIS — B3731 Acute candidiasis of vulva and vagina: Secondary | ICD-10-CM

## 2023-12-03 MED ORDER — FLUCONAZOLE 150 MG PO TABS: 150.0000 mg | ORAL_TABLET | Freq: Once | ORAL | 0 refills | Status: AC

## 2023-12-10 ENCOUNTER — Ambulatory Visit (INDEPENDENT_AMBULATORY_CARE_PROVIDER_SITE_OTHER): Admitting: Obstetrics and Gynecology

## 2023-12-10 ENCOUNTER — Encounter: Payer: Self-pay | Admitting: Obstetrics and Gynecology

## 2023-12-10 VITALS — BP 110/64 | HR 102 | Temp 98.3°F | Wt 149.0 lb

## 2023-12-10 DIAGNOSIS — N898 Other specified noninflammatory disorders of vagina: Secondary | ICD-10-CM

## 2023-12-10 DIAGNOSIS — Z09 Encounter for follow-up examination after completed treatment for conditions other than malignant neoplasm: Secondary | ICD-10-CM

## 2023-12-10 LAB — WET PREP FOR TRICH, YEAST, CLUE

## 2023-12-10 NOTE — Addendum Note (Signed)
 Addended by: DALLIE BOLLARD V on: 12/10/2023 10:05 AM   Modules accepted: Orders
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
## 2023-12-10 NOTE — Patient Instructions (Signed)
 Continue pelvic rest for 6wk or longer if you are still spotting or cramping. This includes use of tampons, intercourse, douching, public bathing (including hot tubs, pools, the ocean).

## 2023-12-25 NOTE — Telephone Encounter (Signed)
 See results dated 11/28/23.   Encounter closed

## 2024-01-20 ENCOUNTER — Encounter: Payer: Self-pay | Admitting: Obstetrics and Gynecology

## 2024-01-22 ENCOUNTER — Ambulatory Visit: Payer: Self-pay | Admitting: Obstetrics and Gynecology

## 2024-01-22 ENCOUNTER — Encounter: Payer: Self-pay | Admitting: Obstetrics and Gynecology

## 2024-01-22 VITALS — BP 100/62 | HR 75 | Temp 97.9°F | Wt 149.0 lb

## 2024-01-22 DIAGNOSIS — Z09 Encounter for follow-up examination after completed treatment for conditions other than malignant neoplasm: Secondary | ICD-10-CM
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
## 2024-09-29 ENCOUNTER — Ambulatory Visit: Admitting: Radiology
